# Patient Record
Sex: Male | Born: 1975 | Hispanic: No | Marital: Married | Smoking: Never smoker
Health system: Southern US, Community
[De-identification: ages and names within clinical notes are randomized; demographics above are authoritative.]

## PROBLEM LIST (undated history)

## (undated) DIAGNOSIS — T7840XA Allergy, unspecified, initial encounter: Secondary | ICD-10-CM

## (undated) DIAGNOSIS — I10 Essential (primary) hypertension: Secondary | ICD-10-CM

## (undated) HISTORY — DX: Essential (primary) hypertension: I10

## (undated) HISTORY — DX: Allergy, unspecified, initial encounter: T78.40XA

---

## 2006-04-20 ENCOUNTER — Emergency Department: Payer: Self-pay | Admitting: Emergency Medicine

## 2009-03-20 ENCOUNTER — Ambulatory Visit: Payer: Self-pay | Admitting: Internal Medicine

## 2010-09-06 ENCOUNTER — Emergency Department: Payer: Self-pay | Admitting: Emergency Medicine

## 2010-09-13 ENCOUNTER — Emergency Department: Payer: Self-pay | Admitting: Emergency Medicine

## 2010-09-16 ENCOUNTER — Emergency Department: Payer: Self-pay | Admitting: Emergency Medicine

## 2011-04-16 ENCOUNTER — Ambulatory Visit: Payer: Self-pay | Admitting: Internal Medicine

## 2015-06-10 ENCOUNTER — Encounter: Payer: Self-pay | Admitting: *Deleted

## 2015-06-18 ENCOUNTER — Ambulatory Visit (INDEPENDENT_AMBULATORY_CARE_PROVIDER_SITE_OTHER): Payer: Medicaid Other | Admitting: General Surgery

## 2015-06-18 ENCOUNTER — Encounter: Payer: Self-pay | Admitting: General Surgery

## 2015-06-18 VITALS — BP 142/98 | HR 70 | Resp 16 | Ht 78.0 in | Wt >= 6400 oz

## 2015-06-18 DIAGNOSIS — L918 Other hypertrophic disorders of the skin: Secondary | ICD-10-CM | POA: Diagnosis not present

## 2015-06-18 NOTE — Patient Instructions (Signed)
Patient to return as needed. 

## 2015-06-18 NOTE — Progress Notes (Signed)
Patient ID: Johnathan Shannon, male   DOB: 07-Aug-1975, 40 y.o.   MRN: [id number]  Chief Complaint  Patient presents with  . Other    abdomen cyst    HPI Less L Johnathan Shannon is a 40 y.o. male here today for a evaluation of abdomen cyst. Patient noticed this about 5 months ago. The area is the size of a quarter and flat. No pain or tenderness.  I have reviewed the history of present illness with the patient. HPI  Past Medical History  Diagnosis Date  . Allergy   . Hypertension     History reviewed. No pertinent past surgical history.  History reviewed. No pertinent family history.  Social History Social History  Substance Use Topics  . Smoking status: Never Smoker   . Smokeless tobacco: None  . Alcohol Use: No    No Known Allergies  Current Outpatient Prescriptions  Medication Sig Dispense Refill  . albuterol (PROVENTIL) (2.5 MG/3ML) 0.083% nebulizer solution Take 2.5 mg by nebulization every 6 (six) hours as needed for wheezing or shortness of breath.    [name]. amLODipine (NORVASC) 5 MG tablet Take 5 mg by mouth daily.    . cyclobenzaprine (FLEXERIL) 5 MG tablet Take 5 mg by mouth 3 (three) times daily as needed for muscle spasms.    [name]. etodolac (LODINE) 400 MG tablet Take 400 mg by mouth 2 (two) times daily.    . fexofenadine (ALLEGRA) 180 MG tablet Take 180 mg by mouth daily.    . sildenafil (VIAGRA) 100 MG tablet Take 100 mg by mouth daily as needed for erectile dysfunction.     No current facility-administered medications for this visit.    Review of Systems Review of Systems  Constitutional: Negative.   Respiratory: Negative.   Cardiovascular: Negative.     Blood pressure 142/98, pulse 70, resp. rate 16, height 6\' 6"  (1.981 m), weight 413 lb (187.336 kg).  Physical Exam Physical Exam  Constitutional: He is oriented to person, place, and time. He appears well-developed and well-nourished.  Neck: Neck supple.  Pulmonary/Chest:     Abdominal: Soft. Bowel sounds are  normal. There is no tenderness.  Neurological: He is alert and oriented to person, place, and time.  Skin: Skin is warm and dry.       Data Reviewed Notes reviewed  Assessment       Skin tag not symptyomatic Plan   Advised to call if the area causes symptoms-pain discomfort, inflammation, etc Patient to return as needed.     PCP:  Beverely RisenKhan, Fozia M This information has been scribed by Ples SpecterJessica Qualls CMA.    Johnathan Shannon 06/18/2015, 11:41 AM

## 2015-06-26 ENCOUNTER — Ambulatory Visit
Admission: RE | Admit: 2015-06-26 | Discharge: 2015-06-26 | Disposition: A | Discharge status: Home / Self Care | Payer: Medicaid Other | Source: Ambulatory Visit | Attending: Nurse Practitioner | Admitting: Nurse Practitioner

## 2015-06-26 ENCOUNTER — Other Ambulatory Visit: Payer: Self-pay | Admitting: Nurse Practitioner

## 2015-06-26 DIAGNOSIS — M545 Low back pain: Secondary | ICD-10-CM

## 2015-06-26 DIAGNOSIS — M542 Cervicalgia: Secondary | ICD-10-CM | POA: Insufficient documentation

## 2015-06-26 DIAGNOSIS — M5136 Other intervertebral disc degeneration, lumbar region: Secondary | ICD-10-CM | POA: Diagnosis not present

## 2015-10-23 ENCOUNTER — Emergency Department
Admission: EM | Admit: 2015-10-23 | Discharge: 2015-10-23 | Disposition: A | Discharge status: Home / Self Care | Payer: Medicaid Other | Attending: Emergency Medicine | Admitting: Emergency Medicine

## 2015-10-23 ENCOUNTER — Encounter: Payer: Self-pay | Admitting: Emergency Medicine

## 2015-10-23 DIAGNOSIS — W268XXA Contact with other sharp object(s), not elsewhere classified, initial encounter: Secondary | ICD-10-CM | POA: Diagnosis not present

## 2015-10-23 DIAGNOSIS — Z23 Encounter for immunization: Secondary | ICD-10-CM | POA: Insufficient documentation

## 2015-10-23 DIAGNOSIS — Y990 Civilian activity done for income or pay: Secondary | ICD-10-CM | POA: Insufficient documentation

## 2015-10-23 DIAGNOSIS — S61215A Laceration without foreign body of left ring finger without damage to nail, initial encounter: Secondary | ICD-10-CM | POA: Diagnosis not present

## 2015-10-23 DIAGNOSIS — I10 Essential (primary) hypertension: Secondary | ICD-10-CM | POA: Insufficient documentation

## 2015-10-23 DIAGNOSIS — Y929 Unspecified place or not applicable: Secondary | ICD-10-CM | POA: Diagnosis not present

## 2015-10-23 DIAGNOSIS — Y9389 Activity, other specified: Secondary | ICD-10-CM | POA: Insufficient documentation

## 2015-10-23 DIAGNOSIS — Z79899 Other long term (current) drug therapy: Secondary | ICD-10-CM | POA: Insufficient documentation

## 2015-10-23 MED ORDER — TETANUS-DIPHTH-ACELL PERTUSSIS 5-2.5-18.5 LF-MCG/0.5 IM SUSP
0.5000 mL | Freq: Once | INTRAMUSCULAR | Status: AC
  Administered 2015-10-23: 0.5 mL via INTRAMUSCULAR
  Filled 2015-10-23: qty 0.5

## 2015-10-23 MED ORDER — BACITRACIN ZINC 500 UNIT/GM EX OINT
TOPICAL_OINTMENT | Freq: Two times a day (BID) | CUTANEOUS | Status: AC
  Administered 2015-10-23: 1 via TOPICAL
  Filled 2015-10-23: qty 0.9

## 2015-10-23 NOTE — ED Notes (Signed)
Pt here with laceration to his left fourth (ring) finger  Pt states  "I was carry a heavy piece of steel and I tripped and when I did I tried to catch the steel and my finger got hung in one of the holes and it cut it open."  10/10 pain reported

## 2015-10-23 NOTE — ED Triage Notes (Signed)
Patient presents to the ED with a laceration to his fourth finger/ring finger on his left hand.  Patient states he was carrying a piece of metal at work and he tripped and fell and cut his finger on the metal.  Patient is in no obvious distress at this time.

## 2015-10-23 NOTE — ED Provider Notes (Signed)
[facility] Emergency Department Provider Note  ____________________________________________  Time seen: Approximately 3:35 PM  I have reviewed the triage vital signs and the nursing notes.   HISTORY  Chief Complaint Laceration    HPI Johnathan Shannon is a 40 y.o. male , NAD, presents to the emergency department with laceration to his left fourth finger that occurred yesterday afternoon around 3 PM while he was at work. States he was carrying a piece of metal, tripped and fell which caused the metal cut his finger.States he did not seek medical attention yesterday as he had another work commitment that he could not miss. Patient denies any numbness, weakness, tingling. Notes the bleeding has been controlled. Has been able to move all digits on the left hand as well as have full grip strength since the incident occurred. Denies any head injury, neck pain, dizziness, LOC. Has not had any chest pain, shortness breath, abdominal pain, nausea, vomiting.   Past Medical History:  Diagnosis Date  . Allergy   . Hypertension     There are no active problems to display for this patient.   History reviewed. No pertinent surgical history.  Prior to Admission medications   Medication Sig Start Date End Date Taking? Authorizing Provider  albuterol (PROVENTIL) (2.5 MG/3ML) 0.083% nebulizer solution Take 2.5 mg by nebulization every 6 (six) hours as needed for wheezing or shortness of breath.    Historical Provider, MD  amLODipine (NORVASC) 5 MG tablet Take 5 mg by mouth daily.    Historical Provider, MD  cyclobenzaprine (FLEXERIL) 5 MG tablet Take 5 mg by mouth 3 (three) times daily as needed for muscle spasms.    Historical Provider, MD  etodolac (LODINE) 400 MG tablet Take 400 mg by mouth 2 (two) times daily.    Historical Provider, MD  fexofenadine (ALLEGRA) 180 MG tablet Take 180 mg by mouth daily.    Historical Provider, MD  sildenafil (VIAGRA) 100 MG tablet Take 100  mg by mouth daily as needed for erectile dysfunction.    Historical Provider, MD    Allergies Review of patient's allergies indicates no known allergies.  No family history on file.  Social History Social History  Substance Use Topics  . Smoking status: Never Smoker  . Smokeless tobacco: Never Used  . Alcohol use No     Review of Systems  Constitutional: No fever/chills Eyes: No visual changes.  Cardiovascular: No chest pain. Respiratory:  No shortness of breath. No wheezing.  Gastrointestinal: No abdominal pain.  No nausea, vomiting. Musculoskeletal: Negative for back pain.  Skin: Positive laceration left fourth finger. Negative for rash, redness, swelling, bruising. Neurological: Negative for headaches, focal weakness or numbness. No tingling, LOC, head injury 10-point ROS otherwise negative.  ____________________________________________   PHYSICAL EXAM:  VITAL SIGNS: ED Triage Vitals [10/23/15 1510]  Enc Vitals Group     BP 134/88     Pulse Rate 84     Resp 18     Temp 98.7 F (37.1 C)     Temp Source Oral     SpO2 96 %     Weight (!) 408 lb (185.1 kg)     Height 6\' 8"  (2.032 m)     Head Circumference      Peak Flow      Pain Score 10     Pain Loc      Pain Edu?      Excl. in GC?      Constitutional: Alert and oriented.  Well appearing and in no acute distress. Eyes: Conjunctivae are normal.  Head: Atraumatic. Neck: Supple with FROM Hematological/Lymphatic/Immunilogical: No cervical lymphadenopathy. Cardiovascular: Good peripheral circulation with 2+ pulses noted in left upper extremity. Capillary refill is brisk in all digits of left hand. Respiratory: Normal respiratory effort without tachypnea or retractions. Musculoskeletal: Full range of motion of the left wrist, hand and all digits without pain or difficulty. Grip strength of the left hand is 5 out of 5. Strength of all digits of the left hand are 5 out of 5.  Neurologic:  Normal speech and  language. No gross focal neurologic deficits are appreciated. Sensation to light touch grossly intact about the digits of the left hand.  Skin:  Superficial laceration to the tip of the left fourth finger measuring approximately 2 cm with hypopigmented skin surrounding. Patient has had the area covered with 2 Band-Aids since the incident yesterday. No active bleeding, oozing, weeping. Area is tender to palpation. Skin is warm, dry. No rash noted. Psychiatric: Mood and affect are normal. Speech and behavior are normal. Patient exhibits appropriate insight and judgement.   ____________________________________________   LABS  None ____________________________________________  EKG  None ____________________________________________  RADIOLOGY  None ____________________________________________    PROCEDURES  Procedure(s) performed: None   .Marland KitchenLaceration Repair Date/Time: 10/23/2015 4:24 PM Performed by: Tye Savoy L Authorized by: Hope Pigeon   Consent:    Consent obtained:  Verbal   Consent given by:  Patient   Risks discussed:  Poor wound healing and infection   Alternatives discussed:  No treatment Anesthesia (see MAR for exact dosages):    Anesthesia method:  None Laceration details:    Location:  Finger   Finger location:  L ring finger   Length (cm):  1.5   Depth (mm):  1 Repair type:    Repair type:  Simple Pre-procedure details:    Patient was prepped and draped in usual sterile fashion: Alcohol and betadine cleanse. Exploration:    Hemostasis achieved with:  Direct pressure   Wound exploration: wound explored through full range of motion and entire depth of wound probed and visualized     Wound extent: no foreign bodies/material noted, no muscle damage noted, no nerve damage noted, no tendon damage noted and no vascular damage noted     Contaminated: no   Treatment:    Area cleansed with:  Betadine   Amount of cleaning:  Standard Skin repair:    Repair  method:  Steri-Strips   Number of Steri-Strips:  2 Approximation:    Approximation:  Close   Vermilion border: well-aligned   Post-procedure details:    Dressing:  Non-adherent dressing   Patient tolerance of procedure:  Tolerated well, no immediate complications     Medications  Tdap (BOOSTRIX) injection 0.5 mL (0.5 mLs Intramuscular Given 10/23/15 1613)  bacitracin ointment (1 application Topical Given 10/23/15 1605)     ____________________________________________   INITIAL IMPRESSION / ASSESSMENT AND PLAN / ED COURSE  Pertinent labs & imaging results that were available during my care of the patient were reviewed by me and considered in my medical decision making (see chart for details).  Clinical Course    Patient's diagnosis is consistent with laceration to left fourth finger without complication. Tetanus was updated today.  Patient will be discharged home with instructions to keep the finger clean and dry over the next 48 hours then may cleanse per usual. Patient is to follow up with [facility] in 48 hours for  wound recheck as needed.  Patient is given ED precautions to return to the ED for any worsening or new symptoms.    ____________________________________________  FINAL CLINICAL IMPRESSION(S) / ED DIAGNOSES  Final diagnoses:  Laceration of fourth finger, left, initial encounter      NEW MEDICATIONS STARTED DURING THIS VISIT:  New Prescriptions   No medications on file         Hope Pigeon, PA-C 10/23/15 1628    Jeanmarie Plant, MD 10/23/15 2215

## 2016-01-07 ENCOUNTER — Other Ambulatory Visit: Payer: Self-pay | Admitting: Nurse Practitioner

## 2016-01-07 DIAGNOSIS — N63 Unspecified lump in unspecified breast: Secondary | ICD-10-CM

## 2016-01-23 ENCOUNTER — Ambulatory Visit
Admission: RE | Admit: 2016-01-23 | Discharge: 2016-01-23 | Disposition: A | Discharge status: Home / Self Care | Payer: Medicaid Other | Source: Ambulatory Visit | Attending: Nurse Practitioner | Admitting: Nurse Practitioner

## 2016-01-23 DIAGNOSIS — N6489 Other specified disorders of breast: Secondary | ICD-10-CM | POA: Insufficient documentation

## 2016-01-23 DIAGNOSIS — N63 Unspecified lump in unspecified breast: Secondary | ICD-10-CM

## 2016-02-04 ENCOUNTER — Ambulatory Visit: Payer: Medicaid Other | Attending: Orthopedic Surgery | Admitting: Physical Therapy

## 2016-02-17 ENCOUNTER — Ambulatory Visit: Payer: Medicaid Other | Attending: Orthopedic Surgery

## 2016-02-17 DIAGNOSIS — G8929 Other chronic pain: Secondary | ICD-10-CM

## 2016-02-17 DIAGNOSIS — M545 Low back pain, unspecified: Secondary | ICD-10-CM

## 2016-02-17 NOTE — Therapy (Signed)
[place] [facility] MAIN [facility] SERVICES [address] [place], [place], [address] Phone: [phone]   Fax:  [phone]  Physical Therapy Evaluation  Patient Details  Name: Johnathan Shannon MRN: [id number] Date of Birth: 03/23/1975 Referring Provider: Martha Clan  Encounter Date: 02/17/2016      PT End of Session - 02/17/16 0949    Visit Number 1   Number of Visits 1   Date for PT Re-Evaluation 02/24/16   Authorization Type medicaid   PT Start Time 0905   PT Stop Time 0945   PT Time Calculation (min) 40 min   Activity Tolerance Patient tolerated treatment well   Behavior During Therapy [facility] for tasks assessed/performed      Past Medical History:  Diagnosis Date  . Allergy   . Hypertension     No past surgical history on file.  There were no vitals filed for this visit.       Subjective Assessment - 02/17/16 0908    Subjective Back pain   Pertinent History Pt reports that he has a history of back pain for the last year. States that he was working for a [employer] but was laid off when they found out he had a history of low back problems. Pt reports that after he stopped working his back pain worsened. He describes the pain as "pressure" midline low back. Only aggrating factor is extended standing. ROS negative for red flags   How long can you walk comfortably? 5 minutes   Diagnostic tests Radiographs: DDD   Patient Stated Goals Decrease pain   Currently in Pain? No/denies   Pain Score 0-No pain  Worst: 10/10, present: 0/10, best: 0/10   Pain Location Back   Pain Orientation Lower;Other (Comment)  Midline   Pain Descriptors / Indicators Pressure   Pain Type Chronic pain   Pain Radiating Towards None   Pain Onset More than a month ago   Pain Frequency Intermittent   Aggravating Factors  Standing for extended periods of time   Pain Relieving Factors Nothing   Multiple Pain Sites No            [facility] PT Assessment -  02/17/16 0912      Assessment   Medical Diagnosis Low back pain   Referring Provider Martha Clan   Onset Date/Surgical Date 02/17/15   Hand Dominance Right  Uses both hands   Next MD Visit None   Prior Therapy None     Precautions   Precautions None     Restrictions   Weight Bearing Restrictions No     Balance Screen   Has the patient fallen in the past 6 months No     Home Environment   Living Environment Private residence     Prior Function   Level of Independence Independent   Vocation Unemployed   Leisure Basketball, working on cars     Cognition   Overall Cognitive Status Within Functional Limits for tasks assessed     Sensation   Additional Comments Normal sensation L2-S2     Posture/Postural Control   Posture Comments Overweight, forward head     ROM / Strength   AROM / PROM / Strength Strength;AROM     AROM   Overall AROM Comments Lumbar AROM is grossly WFL (mild limited in flexion). Painless with AROM and OP. Moderate loss of bilateral hip IR. HS length is limited at approximately 60 degrees bilaterally. Negative slump and SLR. CPA testing is negative for  reproduction of pain L1-L5. Lumbar mobility difficult to assess due to patient's size. Negative sacral thrust. Hip scour is negative.      Strength   Overall Strength Comments 5/5 strength throughout LE's without focal weakness noted     Palpation   Palpation comment No tenderness/pain reported with palpation along spinouse processes or paraspinal muscles. No step-off noted     Transfers   Comments Independent for all transfers and ambulation without assistive device                           PT Education - 02/17/16 0947    Education provided Yes   Education Details Plan of care, HEP, weight loss, regular exercise   Person(s) Educated Patient   Methods Explanation;Demonstration;Handout   Comprehension Verbalized understanding             PT Long Term Goals - 02/17/16 1002       PT LONG TERM GOAL #1   Title Pt will be independent with HEP in order to improve strength and balance in order to decrease fall risk and improve function at home and work.   Time 1   Period Weeks   Status Achieved               Plan - 02/17/16 0949    Clinical Impression Statement Pt is a pleasant 40 yo male referred for chronic low back pain. Pt reports that the only aggravating factor for his back pain is standing for an extended period of time. Reports that radiographs were obtained of lower spine which revealed DDD. PT examination today is unable to reproduce low back pain. Pt is nontender to palpation and posterior to anterior segemental mobility is painless. Negative slump and SLR. Negative hip scour and sacral thrust. AROM of lumbar spine with overpressure is normal and painless in all directions. Pt does have limited mobility with hip internal rotation as well as decreased hamstring length. Discussed walking program as well as enrollment in regular fitness program at local gym. Pt provided information regarding gyms where he may qualify for reduced fees based on self-reported financial constraints. Discussed the benefit of aquatic exercise as pt states that he likes to swim. Discussed the role of weight loss and regular physical activity with respect to chronic low back pain. Pt was issued a program for low back stretching and strengthening. Unfortunately Medicaid will not pay for any follow-up treatment sessions. Pt will be discharged at this time with a home exercise program and encouraged to join a community based fitness center for regular exercise.    Rehab Potential Good   Clinical Impairments Affecting Rehab Potential Positive: motivation, age; Negative: chronicity, overweight   PT Frequency One time visit   PT Duration --  1 week   PT Treatment/Interventions Therapeutic exercise   PT Next Visit Plan Discharge   PT Home Exercise Plan Single knee to chest, double knee to  chest, hooklying lumbar rotation stretch, HS strap stretch, hooklying bridges   Consulted and Agree with Plan of Care Patient      Patient will benefit from skilled therapeutic intervention in order to improve the following deficits and impairments:  Pain  Visit Diagnosis: Chronic midline low back pain without sciatica - Plan: PT plan of care cert/re-cert     Problem List There are no active problems to display for this patient.  Sharalyn InkJason D Ranisha Allaire PT, DPT   Arline Ketter 02/17/2016, 10:08 AM  [place]  [facility] [address] [place], [place], [address] Phone: [phone]   Fax:  [phone]  Name: Evlyn Kannerbjul L Anthes MRN: [id number] Date of Birth: Jul 28, 1975

## 2016-02-17 NOTE — Patient Instructions (Signed)
Knee-to-Chest Stretch: Unilateral    With hand behind right knee, pull knee in to chest until a comfortable stretch is felt in lower back and buttocks. Keep back relaxed. Hold _30___ seconds. Alternate and perform on other leg. Repeat _3___ times per session on each leg. Do __3__ sessions per day.   Knee-to-Chest Stretch: Bilateral    With hands behind knees, pull both knees in to chest until a comfortable stretch is felt in lower back and buttocks. Keep back relaxed. Hold _30___ seconds. Repeat _3___ times per session. Do __3__ sessions per day.   Supine: Leg Stretch With Strap (Basic)    Lie on back with one knee bent, foot flat on floor. Hook strap around other foot. Straighten knee. Bring up leg until you feel a stretch. Hold _30__ seconds. Relax leg completely down to floor. And then perform with the other leg. Perform 3 sets of 30 second stretches on each leg. Do _3__ sessions per day.   Lower Trunk Rotation Stretch    Keeping back flat and feet together, rotate knees to left side. Hold _30___ seconds. Alternate and perform on other side. Repeat _3___ times per session on each side. Do __3__ sessions per day.   Bridge [employer]ose    Press small of back into mat, maintain pelvic position, Focus on engaging posterior hip muscles and lift bottom off ground. Hold for __3__ seconds. Repeat _10___ times. Relax and perform 2 more times. Perform 3 times per day.

## 2016-07-31 ENCOUNTER — Encounter: Payer: Self-pay | Admitting: Emergency Medicine

## 2016-07-31 ENCOUNTER — Emergency Department: Payer: Worker's Compensation

## 2016-07-31 ENCOUNTER — Emergency Department
Admission: EM | Admit: 2016-07-31 | Discharge: 2016-07-31 | Disposition: A | Discharge status: Home / Self Care | Payer: Worker's Compensation | Attending: Emergency Medicine | Admitting: Emergency Medicine

## 2016-07-31 DIAGNOSIS — Y990 Civilian activity done for income or pay: Secondary | ICD-10-CM | POA: Insufficient documentation

## 2016-07-31 DIAGNOSIS — S86812A Strain of other muscle(s) and tendon(s) at lower leg level, left leg, initial encounter: Secondary | ICD-10-CM | POA: Diagnosis not present

## 2016-07-31 DIAGNOSIS — S93402A Sprain of unspecified ligament of left ankle, initial encounter: Secondary | ICD-10-CM

## 2016-07-31 DIAGNOSIS — W010XXA Fall on same level from slipping, tripping and stumbling without subsequent striking against object, initial encounter: Secondary | ICD-10-CM | POA: Insufficient documentation

## 2016-07-31 DIAGNOSIS — I10 Essential (primary) hypertension: Secondary | ICD-10-CM | POA: Insufficient documentation

## 2016-07-31 DIAGNOSIS — T148XXA Other injury of unspecified body region, initial encounter: Secondary | ICD-10-CM

## 2016-07-31 DIAGNOSIS — Y929 Unspecified place or not applicable: Secondary | ICD-10-CM | POA: Insufficient documentation

## 2016-07-31 DIAGNOSIS — Y939 Activity, unspecified: Secondary | ICD-10-CM | POA: Diagnosis not present

## 2016-07-31 DIAGNOSIS — S8992XA Unspecified injury of left lower leg, initial encounter: Secondary | ICD-10-CM | POA: Diagnosis present

## 2016-07-31 MED ORDER — CYCLOBENZAPRINE HCL 10 MG PO TABS: 10.0000 mg | ORAL_TABLET | Freq: Three times a day (TID) | ORAL | 0 refills | Status: DC | PRN

## 2016-07-31 MED ORDER — MELOXICAM 15 MG PO TABS: 15.0000 mg | ORAL_TABLET | Freq: Every day | ORAL | 0 refills | Status: DC

## 2016-07-31 NOTE — ED Notes (Signed)
See triage note  states he was running on Friday and foot stopped and he didn't  Larey SeatFell backwards  Bent left knee under him  Having pain to left knee and lower leg  Ambulates with limp

## 2016-07-31 NOTE — ED Provider Notes (Signed)
[facility] Emergency Department Provider Note ____________________________________________  Time seen: Approximately 1:08 PM  I have reviewed the triage vital signs and the nursing notes.   HISTORY  Chief Complaint Leg Pain    HPI Johnathan Shannon is a 41 y.o. male who presents to the emergency department for evaluation of left lower extremity pain. He states that he injured his leg on Thursday while at work. He states that he fell in the leg folded behind him and he crumpled on top of it. He now has pain behind the knee, in the calf, and in the left ankle with some swelling. He has taken Tylenol without relief. He is able to ambulate, but this increases the pain.  Past Medical History:  Diagnosis Date  . Allergy   . Hypertension     There are no active problems to display for this patient.   History reviewed. No pertinent surgical history.  Prior to Admission medications   Medication Sig Start Date End Date Taking? Authorizing Provider  albuterol (PROVENTIL) (2.5 MG/3ML) 0.083% nebulizer solution Take 2.5 mg by nebulization every 6 (six) hours as needed for wheezing or shortness of breath.    [provider]  amLODipine (NORVASC) 5 MG tablet Take 5 mg by mouth daily.    [provider]  cyclobenzaprine (FLEXERIL) 10 MG tablet Take 1 tablet (10 mg total) by mouth 3 (three) times daily as needed for muscle spasms. 07/31/16   Shakai Dolley B, FNP  fexofenadine (ALLEGRA) 180 MG tablet Take 180 mg by mouth daily.    [provider]  meloxicam (MOBIC) 15 MG tablet Take 1 tablet (15 mg total) by mouth daily. 07/31/16   Marqueze Ramcharan, Rulon Eisenmenger B, FNP  sildenafil (VIAGRA) 100 MG tablet Take 100 mg by mouth daily as needed for erectile dysfunction.    [provider]    Allergies Patient has no known allergies.  History reviewed. No pertinent family history.  Social History Social History  Substance Use Topics  . Smoking status:  Never Smoker  . Smokeless tobacco: Never Used  . Alcohol use No    Review of Systems Constitutional: Positive for recent injury. Cardiovascular: Negative for cool sensation in the left lower extremity Respiratory: Negative for cough or shortness of breath. Musculoskeletal: Positive for pain in the left lower extremity. Skin: Negative for lesion or wound  Neurological: Negative for loss of sensation.  ____________________________________________   PHYSICAL EXAM:  VITAL SIGNS: ED Triage Vitals  Enc Vitals Group     BP 07/31/16 1232 (!) 179/87     Pulse Rate 07/31/16 1232 72     Resp 07/31/16 1232 18     Temp 07/31/16 1232 99 F (37.2 C)     Temp Source 07/31/16 1232 Oral     SpO2 07/31/16 1232 95 %     Weight 07/31/16 1233 (!) 399 lb (181 kg)     Height 07/31/16 1233 6\' 7"  (2.007 m)     Head Circumference --      Peak Flow --      Pain Score 07/31/16 1250 7     Pain Loc --      Pain Edu? --      Excl. in GC? --     Constitutional: Alert and oriented. Well appearing and in no acute distress. Eyes: Conjunctiva are clear and without drainage.  Head: Atraumatic Neck: Nexus criteria is negative Respiratory: Respirations are even and unlabored Musculoskeletal: Full range of motion of the left lower  extremity including the knee and ankle. There is some mild swelling of the left ankle over the ATFL pattern. Neurologic: Alert and oriented. GCS is 15. Neurovascularly intact.  Skin: Warm and dry without wound or lesion noted over left lower extremity.  Psychiatric: Normal behavior. Normal affect.  ____________________________________________   LABS (all labs ordered are listed, but only abnormal results are displayed)  Labs Reviewed - No data to display ____________________________________________  RADIOLOGY  Left tib-fib and ankle are negative for acute abnormality per radiology. ____________________________________________   PROCEDURES  Procedure(s) performed: Ace  bandage applied to the left ankle by RN.  ____________________________________________   INITIAL IMPRESSION / ASSESSMENT AND PLAN / ED COURSE  Johnathan Shannon is a 41 y.o. male who presents to the emergency department for evaluation of left lower extremity pain after sustaining a mechanical, non-syncopal fall at work on Thursday. He states the pain has increased since that time and has not been relieved with Tylenol. X-rays and exam are both consistent with muscular strain and ankle sprain. He'll be given prescriptions for Flexeril and meloxicam. He was instructed to follow up with his primary care provider for symptoms that are not improving over the week. He was advised to return to the emergency department for symptoms that change or worsen if he is unable schedule appointment.  Pertinent labs & imaging results that were available during my care of the patient were reviewed by me and considered in my medical decision making (see chart for details).  _________________________________________   FINAL CLINICAL IMPRESSION(S) / ED DIAGNOSES  Final diagnoses:  Muscle strain  Sprain of left ankle, unspecified ligament, initial encounter    Discharge Medication List as of 07/31/2016  1:46 PM    START taking these medications   Details  meloxicam (MOBIC) 15 MG tablet Take 1 tablet (15 mg total) by mouth daily., Starting Sat 07/31/2016, Print        If controlled substance prescribed during this visit, 12 month history viewed on the [place] prior to issuing an initial prescription for Schedule II or III opiod.    Chinita Pesterriplett, Michiah Mudry B, FNP 07/31/16 1445    Arnaldo NatalMalinda, Paul F, MD 07/31/16 1544

## 2016-07-31 NOTE — ED Triage Notes (Signed)
Pt fell at work on Friday and has been having left leg and ankle pain. Ambulatory to triage with limp r/t pain.  Thought pain would get better but it has not.  NAD. VSS

## 2016-07-31 NOTE — ED Notes (Signed)
Pt states this occurred at work. Pt does not want to file wc at this time since he just started his job. Advised he needs to speak with his employer is he changes his mind.

## 2016-09-08 ENCOUNTER — Encounter: Payer: Self-pay | Admitting: *Deleted

## 2016-09-08 ENCOUNTER — Emergency Department
Admission: EM | Admit: 2016-09-08 | Discharge: 2016-09-08 | Disposition: A | Discharge status: Home / Self Care | Payer: Medicaid Other | Attending: Student in an Organized Health Care Education/Training Program | Admitting: Student in an Organized Health Care Education/Training Program

## 2016-09-08 DIAGNOSIS — Z791 Long term (current) use of non-steroidal anti-inflammatories (NSAID): Secondary | ICD-10-CM | POA: Diagnosis not present

## 2016-09-08 DIAGNOSIS — Z79899 Other long term (current) drug therapy: Secondary | ICD-10-CM | POA: Diagnosis not present

## 2016-09-08 DIAGNOSIS — I10 Essential (primary) hypertension: Secondary | ICD-10-CM | POA: Insufficient documentation

## 2016-09-08 DIAGNOSIS — T63441A Toxic effect of venom of bees, accidental (unintentional), initial encounter: Secondary | ICD-10-CM | POA: Insufficient documentation

## 2016-09-08 DIAGNOSIS — M79632 Pain in left forearm: Secondary | ICD-10-CM | POA: Diagnosis present

## 2016-09-08 MED ORDER — HYDROXYZINE HCL 50 MG PO TABS
50.0000 mg | ORAL_TABLET | Freq: Three times a day (TID) | ORAL | 0 refills | Status: DC | PRN
Start: 2016-09-08 — End: 2017-12-16

## 2016-09-08 MED ORDER — METHYLPREDNISOLONE 4 MG PO TBPK: ORAL_TABLET | ORAL | 0 refills | Status: DC

## 2016-09-08 MED ORDER — HYDROXYZINE HCL 50 MG PO TABS
50.0000 mg | ORAL_TABLET | Freq: Once | ORAL | Status: AC
  Administered 2016-09-08: 50 mg via ORAL
  Filled 2016-09-08: qty 1

## 2016-09-08 MED ORDER — METHYLPREDNISOLONE SODIUM SUCC 125 MG IJ SOLR
125.0000 mg | Freq: Once | INTRAMUSCULAR | Status: AC
  Administered 2016-09-08: 125 mg via INTRAMUSCULAR
  Filled 2016-09-08: qty 2

## 2016-09-08 NOTE — ED Notes (Signed)
See triage note  States he was stung by a wasp to left elbow area yesterday  Left arm is swollen and tender

## 2016-09-08 NOTE — ED Provider Notes (Signed)
[facility] Emergency Department Provider Note   ____________________________________________   First MD Initiated Contact with Patient 09/08/16 1023     (approximate)  I have reviewed the triage vital signs and the nursing notes.   HISTORY  Chief Complaint Insect Bite    HPI Johnathan Shannon is a 41 y.o. male patient complain of a swollen left forearm and itching secondary to a bee sting yesterday. Patient stated this happened/year when he got stung in the left hand causing edema. Patient denies any anti-phylactic signs symptoms. No palliative measures taken for complaint.   Past Medical History:  Diagnosis Date  . Allergy   . Hypertension     There are no active problems to display for this patient.   History reviewed. No pertinent surgical history.  Prior to Admission medications   Medication Sig Start Date End Date Taking? Authorizing Provider  albuterol (PROVENTIL) (2.5 MG/3ML) 0.083% nebulizer solution Take 2.5 mg by nebulization every 6 (six) hours as needed for wheezing or shortness of breath.    [provider]  amLODipine (NORVASC) 5 MG tablet Take 5 mg by mouth daily.    [provider]  cyclobenzaprine (FLEXERIL) 10 MG tablet Take 1 tablet (10 mg total) by mouth 3 (three) times daily as needed for muscle spasms. 07/31/16   Triplett, Cari B, FNP  fexofenadine (ALLEGRA) 180 MG tablet Take 180 mg by mouth daily.    [provider]  hydrOXYzine (ATARAX/VISTARIL) 50 MG tablet Take 1 tablet (50 mg total) by mouth 3 (three) times daily as needed. 09/08/16   Joni ReiningSmith, Johnathan Brander K, PA-C  meloxicam (MOBIC) 15 MG tablet Take 1 tablet (15 mg total) by mouth daily. 07/31/16   Triplett, Cari B, FNP  methylPREDNISolone (MEDROL DOSEPAK) 4 MG TBPK tablet Take Tapered dose as directed . start tomorrow morning 09/08/16   Joni ReiningSmith, Johnathan Coltrane K, PA-C  sildenafil (VIAGRA) 100 MG tablet Take 100 mg by mouth daily as needed for erectile dysfunction.     [provider]    Allergies Bee venom  History reviewed. No pertinent family history.  Social History Social History  Substance Use Topics  . Smoking status: Never Smoker  . Smokeless tobacco: Never Used  . Alcohol use No    Review of Systems  Constitutional: No fever/chills Eyes: No visual changes. ENT: No sore throat. Cardiovascular: Denies chest pain. Respiratory: Denies shortness of breath. Gastrointestinal: No abdominal pain.  No nausea, no vomiting.  No diarrhea.  No constipation. Genitourinary: Negative for dysuria. Musculoskeletal: Negative for back pain. Skin: Negative for rash. Swelling to left forearm Neurological: Negative for headaches, focal weakness or numbness. Allergic/Immunilogical: Bee sting ____________________________________________   PHYSICAL EXAM:  VITAL SIGNS: ED Triage Vitals  Enc Vitals Group     BP 09/08/16 0857 (!) 151/108     Pulse Rate 09/08/16 0857 81     Resp 09/08/16 0857 18     Temp 09/08/16 0857 97.8 F (36.6 C)     Temp Source 09/08/16 0857 Oral     SpO2 09/08/16 0857 97 %     Weight 09/08/16 0859 (!) 380 lb (172.4 kg)     Height 09/08/16 0859 6\' 7"  (2.007 m)     Head Circumference --      Peak Flow --      Pain Score 09/08/16 0859 10     Pain Loc --      Pain Edu? --      Excl. in GC? --  Constitutional: Alert and oriented. Well appearing and in no acute distress.  Eyes: Conjunctivae are normal. PERRL. EOMI. Head: Atraumatic. Nose: No congestion/rhinnorhea. Mouth/Throat: Mucous membranes are moist.  Oropharynx non-erythematous. Neck: No stridor.  palpation. Cardiovascular: Normal rate, regular rhythm. Grossly normal heart sounds.  Good peripheral circulation. Respiratory: Normal respiratory effort.  No retractions. Lungs CTAB. Neurologic:  Normal speech and language. No gross focal neurologic deficits are appreciated. No gait instability. Skin:  Skin is warm, dry and intact. No rash noted. Edematous  non- erythematous left forearm measuring 39-1/2 cm versus 33-1/2 cm of the right forearm. Psychiatric: Mood and affect are normal. Speech and behavior are normal.  ____________________________________________   LABS (all labs ordered are listed, but only abnormal results are displayed)  Labs Reviewed - No data to display ____________________________________________  EKG   ____________________________________________  RADIOLOGY  No results found.  ____________________________________________   PROCEDURES  Procedure(s) performed: None  Procedures  Critical Care performed: No  ____________________________________________   INITIAL IMPRESSION / ASSESSMENT AND PLAN / ED COURSE  Pertinent labs & imaging results that were available during my care of the patient were reviewed by me and considered in my medical decision making (see chart for details).  Localized reaction to insect bite. Patient has no signs of anaphylactic reaction. Patient given discharge care instructions and advised to see his PCP to consider carrying EpiPen.      ____________________________________________   FINAL CLINICAL IMPRESSION(S) / ED DIAGNOSES  Final diagnoses:  Bee sting reaction, accidental or unintentional, initial encounter      NEW MEDICATIONS STARTED DURING THIS VISIT:  Discharge Medication List as of 09/08/2016 10:33 AM    START taking these medications   Details  hydrOXYzine (ATARAX/VISTARIL) 50 MG tablet Take 1 tablet (50 mg total) by mouth 3 (three) times daily as needed., Starting Wed 09/08/2016, Print    methylPREDNISolone (MEDROL DOSEPAK) 4 MG TBPK tablet Take Tapered dose as directed . start tomorrow morning, Print         Note:  This document was prepared using Dragon voice recognition software and may include unintentional dictation errors.    Joni Reining, PA-C 09/08/16 1058    Willy Eddy, MD 09/08/16 [phone]

## 2016-09-08 NOTE — ED Triage Notes (Signed)
States he was stung by a bee yesterday on his left arm, arm slightly swollen, denies any medication after sting yesterday, resp even and unlabored in no acute distress

## 2017-05-04 ENCOUNTER — Emergency Department
Admission: EM | Admit: 2017-05-04 | Discharge: 2017-05-04 | Disposition: A | Discharge status: Home / Self Care | Payer: Medicaid Other | Attending: Emergency Medicine | Admitting: Emergency Medicine

## 2017-05-04 ENCOUNTER — Emergency Department: Payer: Medicaid Other

## 2017-05-04 ENCOUNTER — Encounter: Payer: Self-pay | Admitting: Intensive Care

## 2017-05-04 DIAGNOSIS — Z79899 Other long term (current) drug therapy: Secondary | ICD-10-CM | POA: Diagnosis not present

## 2017-05-04 DIAGNOSIS — M5442 Lumbago with sciatica, left side: Secondary | ICD-10-CM | POA: Insufficient documentation

## 2017-05-04 DIAGNOSIS — M549 Dorsalgia, unspecified: Secondary | ICD-10-CM | POA: Diagnosis present

## 2017-05-04 DIAGNOSIS — I10 Essential (primary) hypertension: Secondary | ICD-10-CM | POA: Diagnosis not present

## 2017-05-04 DIAGNOSIS — M5441 Lumbago with sciatica, right side: Secondary | ICD-10-CM | POA: Insufficient documentation

## 2017-05-04 LAB — POC URINE PREG, ED: Preg Test, Ur: NEGATIVE

## 2017-05-04 MED ORDER — PREDNISONE 50 MG PO TABS
ORAL_TABLET | ORAL | 0 refills | Status: DC
Start: 2017-05-04 — End: 2017-12-16

## 2017-05-04 NOTE — ED Notes (Signed)
Error in documentation. Pt did not have preg test.

## 2017-05-04 NOTE — ED Notes (Signed)
See triage note  Presents with pain to lower back  States pain is non radiating  Also has been having some swelling to both lower ext for about 2 weeks  Denies any SOB

## 2017-05-04 NOTE — ED Triage Notes (Signed)
Patient states he has been having lower back problems X2 years. T11 and T14 per patient is messed up. Patient states lower legs have been burning and hurting him recently. Pt ambulatory in triage with no obvious injuries. Denies recent injury trauma to legs or back

## 2017-05-05 NOTE — ED Provider Notes (Signed)
[facility] Emergency Department Provider Note  ____________________________________________  Time seen: Approximately 12:29 AM  I have reviewed the triage vital signs and the nursing notes.   HISTORY  Chief Complaint Back Pain    HPI Johnathan Shannon is a 42 y.o. male presents to the emergency department with low back pain that radiates into the lower extremities for the past 2 weeks.  Patient denies falls or mechanisms of trauma.  Patient reports that he was doing more heavy lifting in his place of work but is recently been laid off.  Patient reports a history of "back problems".  Patient reports that something was "wrong with T11 and 14".  He denies saddle anesthesi and bowel/bladder incontinence.  Patient reports that he has not followed up with primary care in several years.  Patient denies shortness of breath, chest pain, chest tightness, recent surgery, prolonged immobility, and history of DVT.   Past Medical History:  Diagnosis Date  . Allergy   . Hypertension     There are no active problems to display for this patient.   History reviewed. No pertinent surgical history.  Prior to Admission medications   Medication Sig Start Date End Date Taking? Authorizing Provider  albuterol (PROVENTIL) (2.5 MG/3ML) 0.083% nebulizer solution Take 2.5 mg by nebulization every 6 (six) hours as needed for wheezing or shortness of breath.    [provider]  amLODipine (NORVASC) 5 MG tablet Take 5 mg by mouth daily.    [provider]  cyclobenzaprine (FLEXERIL) 10 MG tablet Take 1 tablet (10 mg total) by mouth 3 (three) times daily as needed for muscle spasms. 07/31/16   Triplett, Cari B, FNP  fexofenadine (ALLEGRA) 180 MG tablet Take 180 mg by mouth daily.    [provider]  hydrOXYzine (ATARAX/VISTARIL) 50 MG tablet Take 1 tablet (50 mg total) by mouth 3 (three) times daily as needed. 09/08/16   Joni ReiningSmith, Ronald K, PA-C  meloxicam (MOBIC) 15  MG tablet Take 1 tablet (15 mg total) by mouth daily. 07/31/16   Triplett, Cari B, FNP  methylPREDNISolone (MEDROL DOSEPAK) 4 MG TBPK tablet Take Tapered dose as directed . start tomorrow morning 09/08/16   Joni ReiningSmith, Ronald K, PA-C  predniSONE (DELTASONE) 50 MG tablet Take one 50 mg tablet once a day for 5 days. 05/04/17   Orvil FeilWoods, Guerin Lashomb M, PA-C  sildenafil (VIAGRA) 100 MG tablet Take 100 mg by mouth daily as needed for erectile dysfunction.    [provider]    Allergies Bee venom  History reviewed. No pertinent family history.  Social History Social History   Tobacco Use  . Smoking status: Never Smoker  . Smokeless tobacco: Never Used  Substance Use Topics  . Alcohol use: No    Alcohol/week: 0.0 oz  . Drug use: No     Review of Systems  Constitutional: No fever/chills Eyes: No visual changes. No discharge ENT: No upper respiratory complaints. Cardiovascular: no chest pain. Respiratory: no cough. No SOB. Gastrointestinal: No abdominal pain.  No nausea, no vomiting.  No diarrhea.  No constipation. Musculoskeletal: Patient has low back pain.  Skin: Negative for rash, abrasions, lacerations, ecchymosis. Neurological: Negative for headaches, focal weakness or numbness.  ____________________________________________   PHYSICAL EXAM:  VITAL SIGNS: ED Triage Vitals [05/04/17 1457]  Enc Vitals Group     BP (!) 164/110     Pulse Rate 78     Resp 16     Temp (!) 97.5 F (36.4 C)  Temp Source Oral     SpO2 98 %     Weight (!) 407 lb 3.2 oz (184.7 kg)     Height 6\' 8"  (2.032 m)     Head Circumference      Peak Flow      Pain Score 10     Pain Loc      Pain Edu?      Excl. in GC?      Constitutional: Alert and oriented. Well appearing and in no acute distress. Eyes: Conjunctivae are normal. PERRL. EOMI. Head: Atraumatic. Cardiovascular: Normal rate, regular rhythm. Normal S1 and S2.  Good peripheral circulation. Respiratory: Normal respiratory effort without  tachypnea or retractions. Lungs CTAB. Good air entry to the bases with no decreased or absent breath sounds. Musculoskeletal: Positive straight leg raise test bilaterally.  Lumbar paraspinal muscle tenderness appreciated. Neurologic:  Normal speech and language. No gross focal neurologic deficits are appreciated.  Skin: Patient has bilateral lower extremity pitting edema. Psychiatric: Mood and affect are normal. Speech and behavior are normal. Patient exhibits appropriate insight and judgement.   ____________________________________________   LABS (all labs ordered are listed, but only abnormal results are displayed)  Labs Reviewed  POC URINE PREG, ED   ____________________________________________  EKG   ____________________________________________  RADIOLOGY Geraldo Pitter, personally viewed and evaluated these images (plain radiographs) as part of my medical decision making, as well as reviewing the written report by the radiologist.  Dg Lumbar Spine 2-3 Views  Result Date: 05/04/2017 CLINICAL DATA:  Low back pain for 1.5 years with leg pain for 1.5 weeks. No known injury or surgery. EXAM: LUMBAR SPINE - 2-3 VIEW COMPARISON:  06/26/2015 FINDINGS: There is no evidence of lumbar spine fracture. Alignment is normal. Intervertebral disc spaces are maintained. IMPRESSION: Stable lumbar spine radiographs without significant disc flattening nor acute osseous abnormality noted. Electronically Signed   By: Tollie Eth M.D.   On: 05/04/2017 16:53    ____________________________________________    PROCEDURES  Procedure(s) performed:    Procedures    Medications - No data to display   ____________________________________________   INITIAL IMPRESSION / ASSESSMENT AND PLAN / ED COURSE  Pertinent labs & imaging results that were available during my care of the patient were reviewed by me and considered in my medical decision making (see chart for details).  Review of the [place] was performed in accordance of the [facility] prior to dispensing any controlled drugs.     Assessment and plan Low back pain with bilateral lower extremity radiculopathy Patient presents to the emergency department with low back pain.  Differential diagnosis included sciatica, lumbar strain and herniated disc.  Patient was treated empirically with prednisone.  Patient was advised to follow-up with primary care regarding bilateral lower extremity pitting edema.  Patient exhibits no chest pain, chest tightness, shortness of breath or abdominal pain.  Further workup is not warranted at this time.  Patient was also advised to address hypertension with primary care noted at triage.  All patient questions were answered.    ____________________________________________  FINAL CLINICAL IMPRESSION(S) / ED DIAGNOSES  Final diagnoses:  Acute bilateral low back pain with bilateral sciatica      NEW MEDICATIONS STARTED DURING THIS VISIT:  ED Discharge Orders        Ordered    predniSONE (DELTASONE) 50 MG tablet     05/04/17 1720          This chart was dictated using voice recognition software/Dragon. Despite  best efforts to proofread, errors can occur which can change the meaning. Any change was purely unintentional.    Orvil Feil, PA-C 05/05/17 0033    Myrna Blazer, MD 05/05/17 [phone]

## 2017-11-01 ENCOUNTER — Ambulatory Visit: Payer: Medicaid Other | Admitting: Adult Health

## 2017-11-01 ENCOUNTER — Ambulatory Visit
Admission: RE | Admit: 2017-11-01 | Discharge: 2017-11-01 | Disposition: A | Discharge status: Home / Self Care | Payer: Medicaid Other | Source: Ambulatory Visit | Attending: Adult Health | Admitting: Adult Health

## 2017-11-01 ENCOUNTER — Encounter: Payer: Self-pay | Admitting: Adult Health

## 2017-11-01 ENCOUNTER — Telehealth: Payer: Self-pay

## 2017-11-01 VITALS — BP 160/110 | HR 94 | Temp 98.9°F | Resp 16 | Ht 79.5 in | Wt >= 6400 oz

## 2017-11-01 DIAGNOSIS — G8929 Other chronic pain: Secondary | ICD-10-CM

## 2017-11-01 DIAGNOSIS — M545 Low back pain, unspecified: Secondary | ICD-10-CM

## 2017-11-01 MED ORDER — MELOXICAM 15 MG PO TABS: 15.0000 mg | ORAL_TABLET | Freq: Every day | ORAL | 0 refills | Status: DC

## 2017-11-01 MED ORDER — METHYLPREDNISOLONE 4 MG PO TBPK
ORAL_TABLET | ORAL | 0 refills | Status: DC
Start: 2017-11-01 — End: 2017-12-16

## 2017-11-01 NOTE — Patient Instructions (Signed)

## 2017-11-01 NOTE — Telephone Encounter (Signed)
Pt advised xray showed no abnormalities

## 2017-11-01 NOTE — Progress Notes (Signed)
[facility] [address] [place], [place] [id number]  Internal MEDICINE  Office Visit Note  Patient Name: Johnathan Shannon  [id number]  [id number]  Date of Service: 11/17/2017  Chief Complaint  Patient presents with  . Back Pain    standing increases lower back pain going on since 2017     HPI Pt is here for a sick visit.  Pt here reporting long time lower back pain that shoots down both of his legs. He reports this has happened to him before, but not this bad.  He would like to see an orthopedist about his back.  Has taken antiinflammatory medication with no relief.  The pain has been persistent for over 2 months now.    Current Medication:  Outpatient Encounter Medications as of 11/01/2017  Medication Sig  . albuterol (PROVENTIL) (2.5 MG/3ML) 0.083% nebulizer solution Take 2.5 mg by nebulization every 6 (six) hours as needed for wheezing or shortness of breath.  [name]. amLODipine (NORVASC) 5 MG tablet Take 5 mg by mouth daily.  . cyclobenzaprine (FLEXERIL) 10 MG tablet Take 1 tablet (10 mg total) by mouth 3 (three) times daily as needed for muscle spasms. (Patient not taking: Reported on 11/01/2017)  . fexofenadine (ALLEGRA) 180 MG tablet Take 180 mg by mouth daily.  . hydrOXYzine (ATARAX/VISTARIL) 50 MG tablet Take 1 tablet (50 mg total) by mouth 3 (three) times daily as needed. (Patient not taking: Reported on 11/01/2017)  . meloxicam (MOBIC) 15 MG tablet Take 1 tablet (15 mg total) by mouth daily.  . methylPREDNISolone (MEDROL DOSEPAK) 4 MG TBPK tablet Take Tapered dose as directed . start tomorrow morning  . predniSONE (DELTASONE) 50 MG tablet Take one 50 mg tablet once a day for 5 days. (Patient not taking: Reported on 11/01/2017)  . sildenafil (VIAGRA) 100 MG tablet Take 100 mg by mouth daily as needed for erectile dysfunction.  . [DISCONTINUED] meloxicam (MOBIC) 15 MG tablet Take 1 tablet (15 mg total) by mouth daily. (Patient not taking: Reported on 11/01/2017)  .  [DISCONTINUED] methylPREDNISolone (MEDROL DOSEPAK) 4 MG TBPK tablet Take Tapered dose as directed . start tomorrow morning (Patient not taking: Reported on 11/01/2017)   No facility-administered encounter medications on file as of 11/01/2017.       Medical History: Past Medical History:  Diagnosis Date  . Allergy   . Hypertension      Vital Signs: BP (!) 160/110   Pulse 94   Temp 98.9 F (37.2 C)   Resp 16   Ht 6' 7.5" (2.019 m)   Wt (!) 411 lb 3.2 oz (186.5 kg)   SpO2 97%   BMI 45.74 kg/m    Review of Systems  Constitutional: Negative.  Negative for chills, fatigue and unexpected weight change.  HENT: Negative.  Negative for congestion, rhinorrhea, sneezing and sore throat.   Eyes: Negative for redness.  Respiratory: Negative.  Negative for cough, chest tightness and shortness of breath.   Cardiovascular: Negative.  Negative for chest pain and palpitations.  Gastrointestinal: Negative.  Negative for abdominal pain, constipation, diarrhea, nausea and vomiting.  Endocrine: Negative.   Genitourinary: Negative.  Negative for dysuria and frequency.  Musculoskeletal: Negative.  Negative for arthralgias, back pain, joint swelling and neck pain.       Lower back pain.  Relieved by sitting still or laying down.   Skin: Negative.  Negative for rash.  Allergic/Immunologic: Negative.   Neurological: Negative.  Negative for tremors and numbness.  Hematological: Negative for adenopathy.  Does not bruise/bleed easily.  Psychiatric/Behavioral: Negative.  Negative for behavioral problems, sleep disturbance and suicidal ideas. The patient is not nervous/anxious.     Physical Exam  Constitutional: He is oriented to person, place, and time. He appears well-developed and well-nourished. No distress.  HENT:  Head: Normocephalic and atraumatic.  Mouth/Throat: Oropharynx is clear and moist. No oropharyngeal exudate.  Eyes: Pupils are equal, round, and reactive to light. EOM are normal.   Neck: Normal range of motion. Neck supple. No JVD present. No tracheal deviation present. No thyromegaly present.  Cardiovascular: Normal rate, regular rhythm and normal heart sounds. Exam reveals no gallop and no friction rub.  No murmur heard. Pulmonary/Chest: Effort normal and breath sounds normal. No respiratory distress. He has no wheezes. He has no rales. He exhibits no tenderness.  Abdominal: Soft. There is no tenderness. There is no guarding.  Musculoskeletal: Normal range of motion.  Lymphadenopathy:    He has no cervical adenopathy.  Neurological: He is alert and oriented to person, place, and time. No cranial nerve deficit.  Skin: Skin is warm and dry. He is not diaphoretic.  Psychiatric: He has a normal mood and affect. His behavior is normal. Judgment and thought content normal.  Nursing note and vitals reviewed.  Assessment/Plan: 1. Chronic midline low back pain without sciatica. Pt will most likely need MRI, referral to ortho.  Take prednisone and mobic as directed. Follow up after ortho appt.   - Ambulatory referral to Orthopedic Surgery - DG Lumbar Spine Complete; Future - methylPREDNISolone (MEDROL DOSEPAK) 4 MG TBPK tablet; Take Tapered dose as directed . start tomorrow morning  Dispense: 21 tablet; Refill: 0 - meloxicam (MOBIC) 15 MG tablet; Take 1 tablet (15 mg total) by mouth daily.  Dispense: 30 tablet; Refill: 0  2. Morbid obesity (HCC) Obesity Counseling: Risk Assessment: An assessment of behavioral risk factors was made today and includes lack of exercise sedentary lifestyle, lack of portion control and poor dietary habits.  Risk Modification Advice: She was counseled on portion control guidelines. Restricting daily caloric intake to. . The detrimental long term effects of obesity on her health and ongoing poor compliance was also discussed with the patient.    General Counseling: Montague verbalizes understanding of the findings of todays visit and agrees with  plan of treatment. I have discussed any further diagnostic evaluation that may be needed or ordered today. We also reviewed his medications today. he has been encouraged to call the office with any questions or concerns that should arise related to todays visit.   Orders Placed This Encounter  Procedures  . DG Lumbar Spine Complete  . Ambulatory referral to Orthopedic Surgery    Meds ordered this encounter  Medications  . methylPREDNISolone (MEDROL DOSEPAK) 4 MG TBPK tablet    Sig: Take Tapered dose as directed . start tomorrow morning    Dispense:  21 tablet    Refill:  0    Order Specific Question:   Supervising Provider    Answer:   Lyndon Code [[id number]]  . meloxicam (MOBIC) 15 MG tablet    Sig: Take 1 tablet (15 mg total) by mouth daily.    Dispense:  30 tablet    Refill:  0    Order Specific Question:   Supervising Provider    Answer:   Lyndon Code [[id number]]    Time spent: 25 Minutes  This patient was seen by Blima Ledger AGNP-C in Collaboration with Dr Lyndon Code as a part  of collaborative care agreement

## 2017-11-02 ENCOUNTER — Telehealth: Payer: Self-pay

## 2017-11-02 NOTE — Telephone Encounter (Signed)
Called patient and informed him that his [facility] in [place] has been scheduled for August 23rd at 10:30AM

## 2017-11-04 ENCOUNTER — Telehealth: Payer: Self-pay

## 2017-11-04 NOTE — Telephone Encounter (Signed)
[facility] called that pt Bp is high 190/130 and he is having headache as per dr Welton Flakeskhan pt need to go to ER

## 2017-12-14 ENCOUNTER — Ambulatory Visit: Payer: Self-pay | Admitting: Adult Health

## 2017-12-16 ENCOUNTER — Ambulatory Visit: Payer: Medicaid Other | Admitting: Adult Health

## 2017-12-16 ENCOUNTER — Encounter: Payer: Self-pay | Admitting: Adult Health

## 2017-12-16 VITALS — BP 171/101 | HR 85 | Resp 16 | Ht 79.5 in | Wt >= 6400 oz

## 2017-12-16 DIAGNOSIS — I10 Essential (primary) hypertension: Secondary | ICD-10-CM

## 2017-12-16 DIAGNOSIS — G8929 Other chronic pain: Secondary | ICD-10-CM | POA: Diagnosis not present

## 2017-12-16 DIAGNOSIS — M545 Low back pain, unspecified: Secondary | ICD-10-CM

## 2017-12-16 MED ORDER — AMLODIPINE BESYLATE 10 MG PO TABS
10.0000 mg | ORAL_TABLET | Freq: Every day | ORAL | 1 refills | Status: DC
Start: 2017-12-16 — End: 2018-05-22

## 2017-12-16 MED ORDER — LOSARTAN POTASSIUM 50 MG PO TABS: 50.0000 mg | ORAL_TABLET | Freq: Every day | ORAL | 1 refills | Status: DC
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
## 2017-12-16 NOTE — Patient Instructions (Signed)

## 2018-01-13 ENCOUNTER — Ambulatory Visit: Payer: Self-pay | Admitting: Adult Health

## 2018-05-22 ENCOUNTER — Ambulatory Visit: Payer: Medicaid Other | Admitting: Adult Health

## 2018-05-22 ENCOUNTER — Encounter: Payer: Self-pay | Admitting: Adult Health

## 2018-05-22 VITALS — BP 162/110 | HR 93 | Resp 16 | Ht 79.0 in | Wt >= 6400 oz

## 2018-05-22 DIAGNOSIS — M545 Low back pain: Secondary | ICD-10-CM | POA: Diagnosis not present

## 2018-05-22 DIAGNOSIS — G8929 Other chronic pain: Secondary | ICD-10-CM

## 2018-05-22 DIAGNOSIS — I10 Essential (primary) hypertension: Secondary | ICD-10-CM | POA: Diagnosis not present

## 2018-05-22 MED ORDER — LOSARTAN POTASSIUM 50 MG PO TABS: 50.0000 mg | ORAL_TABLET | Freq: Every day | ORAL | 1 refills | Status: DC

## 2018-05-22 MED ORDER — AMLODIPINE BESYLATE 10 MG PO TABS: 10.0000 mg | ORAL_TABLET | Freq: Every day | ORAL | 1 refills | Status: DC

## 2018-05-22 NOTE — Progress Notes (Signed)
[facility] [address] [place], [place] [address]  Internal MEDICINE  Office Visit Note  Patient Name: Johnathan Shannon  [id number]  [id number]  Date of Service: 05/22/2018  Chief Complaint  Patient presents with  . Hypertension    elevated   . Shoulder Pain    been in the right shoulder off and on feels like a charlie horse .     HPI Pt is here for follow up on HTN.  His blood pressure today is 162/110.  He reports he has been out of his blood pressure medicine for quite some time.  Will refill his medications today.  He is also requesting a referral to chiropractor for his back pain because he has been to ortho, and would like to see now if chiropractor can do.    Current Medication: Outpatient Encounter Medications as of 05/22/2018  Medication Sig  . albuterol (PROVENTIL) (2.5 MG/3ML) 0.083% nebulizer solution Take 2.5 mg by nebulization every 6 (six) hours as needed for wheezing or shortness of breath.  [name] amLODipine (NORVASC) 10 MG tablet Take 1 tablet (10 mg total) by mouth daily.  . fexofenadine (ALLEGRA) 180 MG tablet Take 180 mg by mouth daily.  [name] losartan (COZAAR) 50 MG tablet Take 1 tablet (50 mg total) by mouth daily.  . sildenafil (VIAGRA) 100 MG tablet Take 100 mg by mouth daily as needed for erectile dysfunction.  . [DISCONTINUED] amLODipine (NORVASC) 10 MG tablet Take 1 tablet (10 mg total) by mouth daily.  . [DISCONTINUED] losartan (COZAAR) 50 MG tablet Take 1 tablet (50 mg total) by mouth daily.  . [DISCONTINUED] meloxicam (MOBIC) 15 MG tablet Take 1 tablet (15 mg total) by mouth daily. (Patient not taking: Reported on 12/16/2017)   No facility-administered encounter medications on file as of 05/22/2018.     Surgical History: No past surgical history on file.  Medical History: Past Medical History:  Diagnosis Date  . Allergy   . Hypertension     Family History: Family History  Family history unknown: Yes    Social History   Socioeconomic  History  . Marital status: Married    Spouse name: Not on file  . Number of children: Not on file  . Years of education: Not on file  . Highest education level: Not on file  Occupational History  . Not on file  Social Needs  . Financial resource strain: Not on file  . Food insecurity:    Worry: Not on file    Inability: Not on file  . Transportation needs:    Medical: Not on file    Non-medical: Not on file  Tobacco Use  . Smoking status: Never Smoker  . Smokeless tobacco: Never Used  Substance and Sexual Activity  . Alcohol use: No    Alcohol/week: 0.0 standard drinks  . Drug use: No  . Sexual activity: Not on file  Lifestyle  . Physical activity:    Days per week: Not on file    Minutes per session: Not on file  . Stress: Not on file  Relationships  . Social connections:    Talks on phone: Not on file    Gets together: Not on file    Attends religious service: Not on file    Active member of club or organization: Not on file    Attends meetings of clubs or organizations: Not on file    Relationship status: Not on file  . Intimate partner violence:    Fear  of current or ex partner: Not on file    Emotionally abused: Not on file    Physically abused: Not on file    Forced sexual activity: Not on file  Other Topics Concern  . Not on file  Social History Narrative  . Not on file      Review of Systems  Constitutional: Negative.  Negative for chills, fatigue and unexpected weight change.  HENT: Negative.  Negative for congestion, rhinorrhea, sneezing and sore throat.   Eyes: Negative for redness.  Respiratory: Negative.  Negative for cough, chest tightness and shortness of breath.   Cardiovascular: Negative.  Negative for chest pain and palpitations.  Gastrointestinal: Negative.  Negative for abdominal pain, constipation, diarrhea, nausea and vomiting.  Endocrine: Negative.   Genitourinary: Negative.  Negative for dysuria and frequency.  Musculoskeletal:  Negative.  Negative for arthralgias, back pain, joint swelling and neck pain.  Skin: Negative.  Negative for rash.  Allergic/Immunologic: Negative.   Neurological: Negative.  Negative for tremors and numbness.  Hematological: Negative for adenopathy. Does not bruise/bleed easily.  Psychiatric/Behavioral: Negative.  Negative for behavioral problems, sleep disturbance and suicidal ideas. The patient is not nervous/anxious.     Vital Signs: BP (!) 162/110   Pulse 93   Resp 16   Ht  (2.007 m)   Wt (!) 405 lb (183.7 kg)   SpO2 93%   BMI 45.63 kg/m    Physical Exam Vitals signs and nursing note reviewed.  Constitutional:      General: He is not in acute distress.    Appearance: He is well-developed. He is not diaphoretic.  HENT:     Head: Normocephalic and atraumatic.     Mouth/Throat:     Pharynx: No oropharyngeal exudate.  Eyes:     Pupils: Pupils are equal, round, and reactive to light.  Neck:     Musculoskeletal: Normal range of motion and neck supple.     Thyroid: No thyromegaly.     Vascular: No JVD.     Trachea: No tracheal deviation.  Cardiovascular:     Rate and Rhythm: Normal rate and regular rhythm.     Heart sounds: Normal heart sounds. No murmur. No friction rub. No gallop.   Pulmonary:     Effort: Pulmonary effort is normal. No respiratory distress.     Breath sounds: Normal breath sounds. No wheezing or rales.  Chest:     Chest wall: No tenderness.  Abdominal:     Palpations: Abdomen is soft.     Tenderness: There is no abdominal tenderness. There is no guarding.  Musculoskeletal: Normal range of motion.  Lymphadenopathy:     Cervical: No cervical adenopathy.  Skin:    General: Skin is warm and dry.  Neurological:     Mental Status: He is alert and oriented to person, place, and time.     Cranial Nerves: No cranial nerve deficit.  Psychiatric:        Behavior: Behavior normal.        Thought Content: Thought content normal.        Judgment:  Judgment normal.    Assessment/Plan: 1. Essential hypertension Refilled patients Norvasc and cozaar.  Encouraged patient to maintain compliance with his medications and to follow up with [place] in 2 weeks.  - amLODipine (NORVASC) 10 MG tablet; Take 1 tablet (10 mg total) by mouth daily.  Dispense: 30 tablet; Refill: 1 - losartan (COZAAR) 50 MG tablet; Take 1 tablet (50 mg total) by mouth  daily.  Dispense: 90 tablet; Refill: 1  2. Chronic midline low back pain without sciatica Pt requesting referral to assess her low back pain.  - Ambulatory referral to Chiropractic  3. Morbid obesity (HCC) Obesity Counseling: Risk Assessment: An assessment of behavioral risk factors was made today and includes lack of exercise sedentary lifestyle, lack of portion control and poor dietary habits.  Risk Modification Advice: She was counseled on portion control guidelines. Restricting daily caloric intake to. . The detrimental long term effects of obesity on her health and ongoing poor compliance was also discussed with the patient.    General Counseling: Arnulfo verbalizes understanding of the findings of todays visit and agrees with plan of treatment. I have discussed any further diagnostic evaluation that may be needed or ordered today. We also reviewed his medications today. he has been encouraged to call the office with any questions or concerns that should arise related to todays visit.    Orders Placed This Encounter  Procedures  . Ambulatory referral to Chiropractic    Meds ordered this encounter  Medications  . amLODipine (NORVASC) 10 MG tablet    Sig: Take 1 tablet (10 mg total) by mouth daily.    Dispense:  30 tablet    Refill:  1  . losartan (COZAAR) 50 MG tablet    Sig: Take 1 tablet (50 mg total) by mouth daily.    Dispense:  90 tablet    Refill:  1    Time spent: 25 Minutes   This patient was seen by Blima Ledger AGNP-C in Collaboration with Dr Lyndon Code as a part of  collaborative care agreement     Johnna Acosta AGNP-C Internal medicine

## 2018-06-06 ENCOUNTER — Ambulatory Visit: Payer: Self-pay | Admitting: Nurse Practitioner

## 2018-06-07 ENCOUNTER — Ambulatory Visit: Payer: Self-pay | Admitting: Adult Health

## 2018-06-12 ENCOUNTER — Ambulatory Visit: Payer: Self-pay | Admitting: Nurse Practitioner

## 2019-01-18 ENCOUNTER — Encounter: Payer: Self-pay | Admitting: Adult Health

## 2019-01-18 ENCOUNTER — Ambulatory Visit: Payer: Medicaid Other | Admitting: Adult Health

## 2019-01-18 ENCOUNTER — Other Ambulatory Visit: Payer: Self-pay

## 2019-01-18 VITALS — BP 170/100 | HR 103 | Temp 96.6°F | Resp 16 | Ht >= 80 in | Wt >= 6400 oz

## 2019-01-18 DIAGNOSIS — G8929 Other chronic pain: Secondary | ICD-10-CM

## 2019-01-18 DIAGNOSIS — I10 Essential (primary) hypertension: Secondary | ICD-10-CM | POA: Diagnosis not present

## 2019-01-18 DIAGNOSIS — M545 Low back pain, unspecified: Secondary | ICD-10-CM

## 2019-01-18 DIAGNOSIS — Z7689 Persons encountering health services in other specified circumstances: Secondary | ICD-10-CM

## 2019-01-18 MED ORDER — LOSARTAN POTASSIUM 50 MG PO TABS
50.0000 mg | ORAL_TABLET | Freq: Every day | ORAL | 1 refills | Status: DC
Start: 2019-01-18 — End: 2019-10-26

## 2019-01-18 MED ORDER — PREDNISONE 10 MG PO TABS: ORAL_TABLET | ORAL | 0 refills | Status: DC

## 2019-01-18 MED ORDER — AMLODIPINE BESYLATE 10 MG PO TABS: 10.0000 mg | ORAL_TABLET | Freq: Every day | ORAL | 1 refills | Status: DC

## 2019-01-18 NOTE — Progress Notes (Signed)
[facility] [address] [place], [place] [address]  Internal MEDICINE  Office Visit Note  Patient Name: Johnathan Shannon  [id number]  [id number]  Date of Service: 02/04/2019  Chief Complaint  Patient presents with  . Back Pain    going on months lower and middle back  . Hypertension     HPI Pt is here for a sick visit. Pt is here for recurrent low back pain.  An x ray was done last year that show no abnormality. He has been doing more physical work lately, and thinks this has aggravated his pain.  His blood pressure is significantly elevated today, he Denies Chest pain, Shortness of breath, palpitations, headache, or blurred vision. He reports he has been out of his blood pressure medication lately, and needs refills.    Current Medication:  Outpatient Encounter Medications as of 01/18/2019  Medication Sig  . albuterol (PROVENTIL) (2.5 MG/3ML) 0.083% nebulizer solution Take 2.5 mg by nebulization every 6 (six) hours as needed for wheezing or shortness of breath.  [name] amLODipine (NORVASC) 10 MG tablet Take 1 tablet (10 mg total) by mouth daily.  . fexofenadine (ALLEGRA) 180 MG tablet Take 180 mg by mouth daily.  [name] losartan (COZAAR) 50 MG tablet Take 1 tablet (50 mg total) by mouth daily.  . sildenafil (VIAGRA) 100 MG tablet Take 100 mg by mouth daily as needed for erectile dysfunction.  . [DISCONTINUED] amLODipine (NORVASC) 10 MG tablet Take 1 tablet (10 mg total) by mouth daily.  . [DISCONTINUED] losartan (COZAAR) 50 MG tablet Take 1 tablet (50 mg total) by mouth daily.  . predniSONE (DELTASONE) 10 MG tablet Use per dose pack   No facility-administered encounter medications on file as of 01/18/2019.       Medical History: Past Medical History:  Diagnosis Date  . Allergy   . Hypertension      Vital Signs: BP (!) 170/100   Pulse (!) 103   Temp (!) 96.6 F (35.9 C)   Resp 16   Ht 6\' 8"  (2.032 m)   Wt (!) 431 lb (195.5 kg)   SpO2 96%   BMI 47.35 kg/m     Review of Systems  Constitutional: Negative.  Negative for chills, fatigue and unexpected weight change.  HENT: Negative.  Negative for congestion, rhinorrhea, sneezing and sore throat.   Eyes: Negative for redness.  Respiratory: Negative.  Negative for cough, chest tightness and shortness of breath.   Cardiovascular: Negative.  Negative for chest pain and palpitations.  Gastrointestinal: Negative.  Negative for abdominal pain, constipation, diarrhea, nausea and vomiting.  Endocrine: Negative.   Genitourinary: Negative.  Negative for dysuria and frequency.  Musculoskeletal: Positive for back pain. Negative for arthralgias, joint swelling and neck pain.  Skin: Negative.  Negative for rash.  Allergic/Immunologic: Negative.   Neurological: Negative.  Negative for tremors and numbness.  Hematological: Negative for adenopathy. Does not bruise/bleed easily.  Psychiatric/Behavioral: Negative.  Negative for behavioral problems, sleep disturbance and suicidal ideas. The patient is not nervous/anxious.     Physical Exam Vitals signs and nursing note reviewed.  Constitutional:      General: He is not in acute distress.    Appearance: He is well-developed. He is not diaphoretic.  HENT:     Head: Normocephalic and atraumatic.     Mouth/Throat:     Pharynx: No oropharyngeal exudate.  Eyes:     Pupils: Pupils are equal, round, and reactive to light.  Neck:     Musculoskeletal:  Normal range of motion and neck supple.     Thyroid: No thyromegaly.     Vascular: No JVD.     Trachea: No tracheal deviation.  Cardiovascular:     Rate and Rhythm: Normal rate and regular rhythm.     Heart sounds: Normal heart sounds. No murmur. No friction rub. No gallop.   Pulmonary:     Effort: Pulmonary effort is normal. No respiratory distress.     Breath sounds: Normal breath sounds. No wheezing or rales.  Chest:     Chest wall: No tenderness.  Abdominal:     Palpations: Abdomen is soft.     Tenderness:  There is no abdominal tenderness. There is no guarding.  Musculoskeletal: Normal range of motion.  Lymphadenopathy:     Cervical: No cervical adenopathy.  Skin:    General: Skin is warm and dry.  Neurological:     Mental Status: He is alert and oriented to person, place, and time.     Cranial Nerves: No cranial nerve deficit.  Psychiatric:        Behavior: Behavior normal.        Thought Content: Thought content normal.        Judgment: Judgment normal.    Assessment/Plan: 1. Essential hypertension Take losartan and Norvasc as prescribed.  If BP does not come down, call clinic, or go to ER.  Pt instructed to take meds immediately, and to call/go to ER, if any symptoms like chest pain, dizziness, sob, headache or blurred vision begin.  - losartan (COZAAR) 50 MG tablet; Take 1 tablet (50 mg total) by mouth daily.  Dispense: 90 tablet; Refill: 1 - amLODipine (NORVASC) 10 MG tablet; Take 1 tablet (10 mg total) by mouth daily.  Dispense: 90 tablet; Refill: 1  2. Chronic midline low back pain without sciatica Take Prednisone as discussed.  Also instructed patient to use heat or cold, whatever is most helpful, for 20 minutes on, 2 hours off. May also take NSAIDS as directed.  If pain gets worse or does not improve, please   3. Morbid obesity (HCC) Obesity Counseling: Risk Assessment: An assessment of behavioral risk factors was made today and includes lack of exercise sedentary lifestyle, lack of portion control and poor dietary habits.  Risk Modification Advice: She was counseled on portion control guidelines. Restricting daily caloric intake to. . The detrimental long term effects of obesity on her health and ongoing poor compliance was also discussed with the patient.  4. Establishing care with new doctor, encounter for Labs ordered for patient, and will discuss at follow up in one month.   - CBC with Differential/Platelet - Lipid Panel With LDL/HDL Ratio - TSH - T4, free -  Comprehensive metabolic panel - PSA - Prolactin - Testosterone,Free and Total  General Counseling: Johnathan Shannon verbalizes understanding of the findings of todays visit and agrees with plan of treatment. I have discussed any further diagnostic evaluation that may be needed or ordered today. We also reviewed his medications today. he has been encouraged to call the office with any questions or concerns that should arise related to todays visit.   Orders Placed This Encounter  Procedures  . CBC with Differential/Platelet  . Lipid Panel With LDL/HDL Ratio  . TSH  . T4, free  . Comprehensive metabolic panel  . PSA  . Prolactin  . Testosterone,Free and Total    Meds ordered this encounter  Medications  . losartan (COZAAR) 50 MG tablet    Sig: Take 1  tablet (50 mg total) by mouth daily.    Dispense:  90 tablet    Refill:  1  . amLODipine (NORVASC) 10 MG tablet    Sig: Take 1 tablet (10 mg total) by mouth daily.    Dispense:  90 tablet    Refill:  1  . predniSONE (DELTASONE) 10 MG tablet    Sig: Use per dose pack    Dispense:  21 tablet    Refill:  0    Time spent: 25 Minutes  This patient was seen by Orson Gear AGNP-C in Collaboration with Dr Lavera Guise as a part of collaborative care agreement.  Kendell Bane AGNP-C Internal Medicine

## 2019-02-16 ENCOUNTER — Telehealth: Payer: Self-pay

## 2019-02-16 NOTE — Telephone Encounter (Signed)
Confirmed appointment with patient. klh °

## 2019-02-16 NOTE — Telephone Encounter (Signed)
Called lmom informing patient of appointment. klh 

## 2019-02-20 ENCOUNTER — Encounter: Payer: Medicaid Other | Admitting: Adult Health

## 2019-02-23 ENCOUNTER — Telehealth: Payer: Self-pay

## 2019-02-23 NOTE — Telephone Encounter (Signed)
BILLED MISSED APPT FEE 02/20/2019 

## 2019-09-21 ENCOUNTER — Ambulatory Visit
Admission: RE | Admit: 2019-09-21 | Discharge: 2019-09-21 | Disposition: A | Discharge status: Home / Self Care | Payer: Medicaid Other | Source: Ambulatory Visit | Attending: Nurse Practitioner | Admitting: Nurse Practitioner

## 2019-09-21 ENCOUNTER — Other Ambulatory Visit
Admission: RE | Admit: 2019-09-21 | Discharge: 2019-09-21 | Disposition: A | Discharge status: Home / Self Care | Payer: Medicaid Other | Source: Home / Self Care | Attending: Nurse Practitioner | Admitting: Nurse Practitioner

## 2019-09-21 ENCOUNTER — Other Ambulatory Visit: Payer: Self-pay

## 2019-09-21 ENCOUNTER — Ambulatory Visit
Admission: RE | Admit: 2019-09-21 | Discharge: 2019-09-21 | Disposition: A | Discharge status: Home / Self Care | Payer: Medicaid Other | Attending: Nurse Practitioner | Admitting: Nurse Practitioner

## 2019-09-21 ENCOUNTER — Encounter: Payer: Self-pay | Admitting: Nurse Practitioner

## 2019-09-21 ENCOUNTER — Ambulatory Visit: Payer: Medicaid Other | Admitting: Nurse Practitioner

## 2019-09-21 VITALS — BP 164/107 | HR 85 | Temp 96.3°F | Resp 16 | Ht >= 80 in | Wt >= 6400 oz

## 2019-09-21 DIAGNOSIS — M5441 Lumbago with sciatica, right side: Secondary | ICD-10-CM

## 2019-09-21 DIAGNOSIS — I10 Essential (primary) hypertension: Secondary | ICD-10-CM

## 2019-09-21 DIAGNOSIS — R0600 Dyspnea, unspecified: Secondary | ICD-10-CM

## 2019-09-21 DIAGNOSIS — R0609 Other forms of dyspnea: Secondary | ICD-10-CM

## 2019-09-21 DIAGNOSIS — G8929 Other chronic pain: Secondary | ICD-10-CM | POA: Insufficient documentation

## 2019-09-21 DIAGNOSIS — M5442 Lumbago with sciatica, left side: Secondary | ICD-10-CM

## 2019-09-21 DIAGNOSIS — M545 Low back pain: Secondary | ICD-10-CM | POA: Insufficient documentation

## 2019-09-21 LAB — URINALYSIS, COMPLETE (UACMP) WITH MICROSCOPIC
Bacteria, UA: NONE SEEN
Bilirubin Urine: NEGATIVE
Glucose, UA: NEGATIVE mg/dL
Hgb urine dipstick: NEGATIVE
Ketones, ur: NEGATIVE mg/dL
Leukocytes,Ua: NEGATIVE
Nitrite: NEGATIVE
Protein, ur: NEGATIVE mg/dL
Specific Gravity, Urine: 1.025 (ref 1.005–1.030)
Squamous Epithelial / HPF: NONE SEEN (ref 0–5)
pH: 5 (ref 5.0–8.0)

## 2019-09-21 LAB — CBC
HCT: 48 % (ref 39.0–52.0)
Hemoglobin: 15.9 g/dL (ref 13.0–17.0)
MCH: 28 pg (ref 26.0–34.0)
MCHC: 33.1 g/dL (ref 30.0–36.0)
MCV: 84.7 fL (ref 80.0–100.0)
Platelets: 221 10*3/uL (ref 150–400)
RBC: 5.67 MIL/uL (ref 4.22–5.81)
RDW: 13.6 % (ref 11.5–15.5)
WBC: 6 10*3/uL (ref 4.0–10.5)
nRBC: 0 % (ref 0.0–0.2)

## 2019-09-21 LAB — COMPREHENSIVE METABOLIC PANEL
ALT: 36 U/L (ref 0–44)
AST: 24 U/L (ref 15–41)
Albumin: 4.3 g/dL (ref 3.5–5.0)
Alkaline Phosphatase: 77 U/L (ref 38–126)
Anion gap: 10 (ref 5–15)
BUN: 10 mg/dL (ref 6–20)
CO2: 26 mmol/L (ref 22–32)
Calcium: 9.2 mg/dL (ref 8.9–10.3)
Chloride: 107 mmol/L (ref 98–111)
Creatinine, Ser: 0.99 mg/dL (ref 0.61–1.24)
GFR calc Af Amer: >60 mL/min (ref >60)
GFR calc non Af Amer: >60 mL/min (ref >60)
Glucose, Bld: 93 mg/dL (ref 70–99)
Potassium: 3.7 mmol/L (ref 3.5–5.1)
Sodium: 143 mmol/L (ref 135–145)
Total Bilirubin: 0.8 mg/dL (ref 0.3–1.2)
Total Protein: 7.4 g/dL (ref 6.5–8.1)

## 2019-09-21 LAB — LIPID PANEL
Cholesterol: 179 mg/dL (ref 0–200)
HDL: 39 mg/dL — ABNORMAL LOW (ref >40)
LDL Cholesterol: 129 mg/dL — ABNORMAL HIGH (ref 0–99)
Total CHOL/HDL Ratio: 4.6 RATIO
Triglycerides: 56 mg/dL (ref <150)
VLDL: 11 mg/dL (ref 0–40)

## 2019-09-21 LAB — T4, FREE: Free T4: 0.97 ng/dL (ref 0.61–1.12)

## 2019-09-21 LAB — TSH: TSH: 1.371 u[IU]/mL (ref 0.350–4.500)

## 2019-09-21 MED ORDER — LOSARTAN POTASSIUM-HCTZ 50-12.5 MG PO TABS: 1.0000 | ORAL_TABLET | Freq: Every day | ORAL | 3 refills | Status: DC

## 2019-09-21 MED ORDER — PREDNISONE 10 MG (21) PO TBPK: ORAL_TABLET | ORAL | 0 refills | Status: DC

## 2019-09-21 MED ORDER — ETODOLAC 400 MG PO TABS: 400.0000 mg | ORAL_TABLET | Freq: Two times a day (BID) | ORAL | 3 refills | Status: DC

## 2019-09-21 NOTE — Progress Notes (Signed)
[name] [address], [place] [address]  Internal MEDICINE  Office Visit Note  Patient Name: Johnathan Shannon  [id number]  [id number]  Date of Service: 09/26/2019   Pt is here for a sick visit.   Chief Complaint  Patient presents with   Edema    Swelling in left leg   Back Pain    Degerative back; can't stand still     The patient is here for acute visit. Today, he is c/o elevated blood pressure with swelling in both legs. Swelling is non ender with no redness is present. He is prescribed both amlodipine and losartan, however, he is not taking these medications. He has not been taking these in some time.  He is also c/o severe lower back pain. Pain hurts much more when he is standing.       Current Medication:  Outpatient Encounter Medications as of 09/21/2019  Medication Sig Note   albuterol (PROVENTIL) (2.5 MG/3ML) 0.083% nebulizer solution Take 2.5 mg by nebulization every 6 (six) hours as needed for wheezing or shortness of breath.    fexofenadine (ALLEGRA) 180 MG tablet Take 180 mg by mouth daily.    losartan (COZAAR) 50 MG tablet Take 1 tablet (50 mg total) by mouth daily.    [DISCONTINUED] amLODipine (NORVASC) 10 MG tablet Take 1 tablet (10 mg total) by mouth daily. 09/21/2019: patient sto[[ed taking   [DISCONTINUED] predniSONE (DELTASONE) 10 MG tablet Use per dose pack    [DISCONTINUED] sildenafil (VIAGRA) 100 MG tablet Take 100 mg by mouth daily as needed for erectile dysfunction.    losartan-hydrochlorothiazide (HYZAAR) 50-12.5 MG tablet Take 1 tablet by mouth daily.    predniSONE (STERAPRED UNI-PAK 21 TAB) 10 MG (21) TBPK tablet 6 day taper - take by mouth as directed for 6 days    [DISCONTINUED] etodolac (LODINE) 400 MG tablet Take 1 tablet (400 mg total) by mouth 2 (two) times daily.    No facility-administered encounter medications on file as of 09/21/2019.      Medical History: Past Medical History:  Diagnosis Date    Allergy    Hypertension      Today's Vitals   09/21/19 1027  BP: (!) 164/107  Pulse: 85  Resp: 16  Temp: (!) 96.3 F (35.7 C)  SpO2: 97%  Weight: (!) 437 lb 9.6 oz (198.5 kg)  Height: 6\' 8"  (2.032 m)   Body mass index is 48.07 kg/m.  Review of Systems  Constitutional: Positive for activity change. Negative for chills, fatigue and unexpected weight change.       Patient not as active due to severity of lower back pain.   HENT: Negative for congestion, postnasal drip, rhinorrhea, sneezing and sore throat.   Respiratory: Negative for cough, chest tightness, shortness of breath and wheezing.   Cardiovascular: Negative for chest pain and palpitations.       Blood pressure moderately elevated.   Gastrointestinal: Negative for abdominal pain, constipation, diarrhea, nausea and vomiting.  Endocrine: Negative for cold intolerance, heat intolerance, polydipsia and polyuria.  Musculoskeletal: Positive for arthralgias, back pain and myalgias. Negative for joint swelling and neck pain.  Skin: Negative for rash.  Allergic/Immunologic: Negative for environmental allergies.  Neurological: Positive for headaches. Negative for dizziness, tremors and numbness.  Hematological: Negative for adenopathy. Does not bruise/bleed easily.  Psychiatric/Behavioral: Negative for behavioral problems (Depression), sleep disturbance and suicidal ideas. The patient is not nervous/anxious.     Physical Exam Vitals and nursing note reviewed.  Constitutional:  General: He is not in acute distress.    Appearance: Normal appearance. He is well-developed. He is obese. He is not diaphoretic.  HENT:     Head: Normocephalic and atraumatic.     Nose: Nose normal.     Mouth/Throat:     Pharynx: No oropharyngeal exudate.  Eyes:     Pupils: Pupils are equal, round, and reactive to light.  Neck:     Thyroid: No thyromegaly.     Vascular: No JVD.     Trachea: No tracheal deviation.  Cardiovascular:     Rate  and Rhythm: Normal rate and regular rhythm.     Heart sounds: Normal heart sounds. No murmur heard.  No friction rub. No gallop.      Comments: There is non-pitting edema present in bilateral lower extremities.  Pulmonary:     Effort: Pulmonary effort is normal. No respiratory distress.     Breath sounds: Normal breath sounds. No wheezing or rales.  Chest:     Chest wall: No tenderness.  Abdominal:     Palpations: Abdomen is soft.  Musculoskeletal:        General: Normal range of motion.     Cervical back: Normal range of motion and neck supple.     Comments: There is moderate back pain present, worse with medium palpation. No bruising or swelling noted at this time. Grimacing noted when patient bends or twists at the waist.   Lymphadenopathy:     Cervical: No cervical adenopathy.  Skin:    General: Skin is warm and dry.  Neurological:     Mental Status: He is alert and oriented to person, place, and time.     Cranial Nerves: No cranial nerve deficit.  Psychiatric:        Mood and Affect: Mood normal.        Behavior: Behavior normal.        Thought Content: Thought content normal.        Judgment: Judgment normal.    Assessment/Plan:  1. Chronic bilateral low back pain with bilateral sciatica Start lodine 400mg  twice daily as needed for pain/inflammation. Will also start prednisone taper. Take as directed for 6 days. X-ray of lumbar spine ordered. Refer to orthopedics as indicated.  - DG Lumbar Spine Complete; Future - predniSONE (STERAPRED UNI-PAK 21 TAB) 10 MG (21) TBPK tablet; 6 day taper - take by mouth as directed for 6 days  Dispense: 21 tablet; Refill: 0  2. Essential hypertension Start losartan/HCTZ 50/12.5mg  daily. Advised him to limit salt and increase water intake. Monitor closely.  - losartan-hydrochlorothiazide (HYZAAR) 50-12.5 MG tablet; Take 1 tablet by mouth daily.  Dispense: 30 tablet; Refill: 3  3. Dyspnea on exertion Echocardiogram ordered for further  evaluation.  - ECHOCARDIOGRAM COMPLETE; Future  General Counseling: [name] verbalizes understanding of the findings of todays visit and agrees with plan of treatment. I have discussed any further diagnostic evaluation that may be needed or ordered today. We also reviewed his medications today. he has been encouraged to call the office with any questions or concerns that should arise related to todays visit.    Counseling:  Hypertension Counseling:   The following hypertensive lifestyle modification were recommended and discussed:  1. Limiting alcohol intake to less than 1 oz/day of ethanol:(24 oz of beer or 8 oz of wine or 2 oz of 100-proof whiskey). 2. Take baby ASA 81 mg daily. 3. Importance of regular aerobic exercise and losing weight. 4. Reduce dietary saturated fat  and cholesterol intake for overall cardiovascular health. 5. Maintaining adequate dietary potassium, calcium, and magnesium intake. 6. Regular monitoring of the blood pressure. 7. Reduce sodium intake to less than 100 mmol/day (less than 2.3 gm of sodium or less than 6 gm of sodium choride)   This patient was seen by Vincent Gros FNP Collaboration with Dr Lyndon Code as a part of collaborative care agreement  Orders Placed This Encounter  Procedures   DG Lumbar Spine Complete   ECHOCARDIOGRAM COMPLETE    Meds ordered this encounter  Medications   losartan-hydrochlorothiazide (HYZAAR) 50-12.5 MG tablet    Sig: Take 1 tablet by mouth daily.    Dispense:  30 tablet    Refill:  3    Order Specific Question:   Supervising Provider    Answer:   Lyndon Code [[id number]]   predniSONE (STERAPRED UNI-PAK 21 TAB) 10 MG (21) TBPK tablet    Sig: 6 day taper - take by mouth as directed for 6 days    Dispense:  21 tablet    Refill:  0    Order Specific Question:   Supervising Provider    Answer:   Lyndon Code [[id number]]   DISCONTD: etodolac (LODINE) 400 MG tablet    Sig: Take 1 tablet (400 mg total) by mouth 2 (two) times  daily.    Dispense:  60 tablet    Refill:  3    Order Specific Question:   Supervising Provider    Answer:   Lyndon Code [[id number]]    Time spent: 30 Minutes

## 2019-09-23 LAB — ANA W/REFLEX IF POSITIVE: Anti Nuclear Antibody (ANA): NEGATIVE

## 2019-09-24 ENCOUNTER — Other Ambulatory Visit: Payer: Self-pay

## 2019-09-24 DIAGNOSIS — M5441 Lumbago with sciatica, right side: Secondary | ICD-10-CM

## 2019-09-24 MED ORDER — ETODOLAC 400 MG PO TABS: 400.0000 mg | ORAL_TABLET | Freq: Two times a day (BID) | ORAL | 3 refills | Status: DC

## 2019-09-24 NOTE — Progress Notes (Signed)
Negative x-ray of lumbar spine. Discuss at visit 10/26/2019. May need ortho referral.

## 2019-09-24 NOTE — Progress Notes (Signed)
Overall, labs good. Discuss with patient at visit 10/26/2019

## 2019-09-26 DIAGNOSIS — R0609 Other forms of dyspnea: Secondary | ICD-10-CM | POA: Insufficient documentation

## 2019-09-26 DIAGNOSIS — I10 Essential (primary) hypertension: Secondary | ICD-10-CM | POA: Insufficient documentation

## 2019-09-26 DIAGNOSIS — M5442 Lumbago with sciatica, left side: Secondary | ICD-10-CM | POA: Insufficient documentation

## 2019-10-17 ENCOUNTER — Telehealth: Payer: Self-pay

## 2019-10-17 NOTE — Telephone Encounter (Signed)
Confirmed and screened for office visit 8/6 

## 2019-10-19 ENCOUNTER — Other Ambulatory Visit: Payer: Self-pay

## 2019-10-19 ENCOUNTER — Ambulatory Visit: Payer: Medicaid Other

## 2019-10-19 DIAGNOSIS — R0609 Other forms of dyspnea: Secondary | ICD-10-CM

## 2019-10-19 DIAGNOSIS — R0602 Shortness of breath: Secondary | ICD-10-CM

## 2019-10-23 ENCOUNTER — Telehealth: Payer: Self-pay

## 2019-10-23 NOTE — Telephone Encounter (Signed)
Confirmed and screened for 10-26-19 ov. Has not been vaccinated.

## 2019-10-26 ENCOUNTER — Telehealth: Payer: Self-pay

## 2019-10-26 ENCOUNTER — Other Ambulatory Visit: Payer: Self-pay

## 2019-10-26 ENCOUNTER — Encounter: Payer: Self-pay | Admitting: Nurse Practitioner

## 2019-10-26 ENCOUNTER — Ambulatory Visit: Payer: Medicaid Other | Admitting: Nurse Practitioner

## 2019-10-26 VITALS — BP 142/100 | HR 79 | Temp 98.2°F | Resp 16 | Ht >= 80 in | Wt >= 6400 oz

## 2019-10-26 DIAGNOSIS — I5189 Other ill-defined heart diseases: Secondary | ICD-10-CM | POA: Diagnosis not present

## 2019-10-26 DIAGNOSIS — I10 Essential (primary) hypertension: Secondary | ICD-10-CM | POA: Diagnosis not present

## 2019-10-26 DIAGNOSIS — M5441 Lumbago with sciatica, right side: Secondary | ICD-10-CM

## 2019-10-26 DIAGNOSIS — G8929 Other chronic pain: Secondary | ICD-10-CM

## 2019-10-26 DIAGNOSIS — M5442 Lumbago with sciatica, left side: Secondary | ICD-10-CM

## 2019-10-26 MED ORDER — LOSARTAN POTASSIUM 50 MG PO TABS: 50.0000 mg | ORAL_TABLET | Freq: Every day | ORAL | 1 refills | Status: DC

## 2019-10-26 MED ORDER — LOSARTAN POTASSIUM-HCTZ 50-12.5 MG PO TABS: 1.0000 | ORAL_TABLET | Freq: Every day | ORAL | 3 refills | Status: DC

## 2019-10-26 NOTE — Telephone Encounter (Signed)
Left pt VM stating that Herbert Seta spoke to him this morning 10/26/19 at his appt about all the results for labs and xray.  dbs

## 2019-10-26 NOTE — Telephone Encounter (Signed)
-----   Message from Carlean Jews, NP sent at 10/26/2019  1:21 PM EDT ----- Very strange because we talked about this when I saw him this morning. Thanks though.   ----- Message ----- From: Loura Back, CMA Sent: 10/26/2019  12:01 PM EDT To: Carlean Jews, NP  Pt called back at 12pm to ask about his lab results and his xray results that was done several weeks ago  I did see a message that states all labs looked good but just wanted to make sure no other issues.  dbs

## 2019-10-26 NOTE — Progress Notes (Signed)
[facility] [address] [place], [place] [address]  Internal MEDICINE  Office Visit Note  Patient Name: Johnathan Shannon  [id number]  [id number]  Date of Service: 11/05/2019  Chief Complaint  Patient presents with  . Follow-up    reviewing labs  . Hypertension  . Quality Metric Gaps    HIV screening    The patient is here for follow up. The patient continues to have severe pain in lower back. This is worse when standing and with exertion. He had x-ray of the lumbar spine and results were negative. Taking anti-inflammatory has helped some, but not all that much.  Blood pressure continues to be moderately elevated, though improved from his last visit. Still has mild swelling in both legs. He had echocardiogram. He has normal LVEF with diastolic dysfunction. He has trace tricuspid regurgitation.  Labs were also done. LDL mildly elevated, but other aspects of lipid panel are good.       Current Medication: Outpatient Encounter Medications as of 10/26/2019  Medication Sig  . albuterol (PROVENTIL) (2.5 MG/3ML) 0.083% nebulizer solution Take 2.5 mg by nebulization every 6 (six) hours as needed for wheezing or shortness of breath.  . etodolac (LODINE) 400 MG tablet Take 1 tablet (400 mg total) by mouth 2 (two) times daily.  . fexofenadine (ALLEGRA) 180 MG tablet Take 180 mg by mouth daily.  [name] losartan (COZAAR) 50 MG tablet Take 1 tablet (50 mg total) by mouth daily.  [name] losartan-hydrochlorothiazide (HYZAAR) 50-12.5 MG tablet Take 1 tablet by mouth daily.  . predniSONE (STERAPRED UNI-PAK 21 TAB) 10 MG (21) TBPK tablet 6 day taper - take by mouth as directed for 6 days  . [DISCONTINUED] losartan (COZAAR) 50 MG tablet Take 1 tablet (50 mg total) by mouth daily.  . [DISCONTINUED] losartan-hydrochlorothiazide (HYZAAR) 50-12.5 MG tablet Take 1 tablet by mouth daily.   No facility-administered encounter medications on file as of 10/26/2019.    Surgical History: History reviewed.  No pertinent surgical history.  Medical History: Past Medical History:  Diagnosis Date  . Allergy   . Hypertension     Family History: Family History  Family history unknown: Yes    Social History   Socioeconomic History  . Marital status: Married    Spouse name: Not on file  . Number of children: Not on file  . Years of education: Not on file  . Highest education level: Not on file  Occupational History  . Not on file  Tobacco Use  . Smoking status: Never Smoker  . Smokeless tobacco: Never Used  Vaping Use  . Vaping Use: Never used  Substance and Sexual Activity  . Alcohol use: Yes    Alcohol/week: 0.0 standard drinks    Comment: rarely  . Drug use: No  . Sexual activity: Not on file  Other Topics Concern  . Not on file  Social History Narrative  . Not on file   Social Determinants of Health   Financial Resource Strain:   . Difficulty of Paying Living Expenses: Not on file  Food Insecurity:   . Worried About Programme researcher, broadcasting/film/video in the Last Year: Not on file  . Ran Out of Food in the Last Year: Not on file  Transportation Needs:   . Lack of Transportation (Medical): Not on file  . Lack of Transportation (Non-Medical): Not on file  Physical Activity:   . Days of Exercise per Week: Not on file  . Minutes of Exercise per Session: Not  on file  Stress:   . Feeling of Stress : Not on file  Social Connections:   . Frequency of Communication with Friends and Family: Not on file  . Frequency of Social Gatherings with Friends and Family: Not on file  . Attends Religious Services: Not on file  . Active Member of Clubs or Organizations: Not on file  . Attends Banker Meetings: Not on file  . Marital Status: Not on file  Intimate Partner Violence:   . Fear of Current or Ex-Partner: Not on file  . Emotionally Abused: Not on file  . Physically Abused: Not on file  . Sexually Abused: Not on file      Review of Systems  Constitutional: Positive for  activity change. Negative for chills, fatigue and unexpected weight change.       Patient not as active due to severity of lower back pain.   HENT: Negative for congestion, postnasal drip, rhinorrhea, sneezing and sore throat.   Respiratory: Negative for cough, chest tightness, shortness of breath and wheezing.   Cardiovascular: Positive for leg swelling. Negative for chest pain and palpitations.       Improved blood pressure, but still elevated.   Gastrointestinal: Negative for abdominal pain, constipation, diarrhea, nausea and vomiting.  Endocrine: Negative for cold intolerance, heat intolerance, polydipsia and polyuria.  Musculoskeletal: Positive for arthralgias, back pain and myalgias. Negative for joint swelling and neck pain.  Skin: Negative for rash.  Allergic/Immunologic: Negative for environmental allergies.  Neurological: Positive for headaches. Negative for dizziness, tremors and numbness.  Hematological: Negative for adenopathy. Does not bruise/bleed easily.  Psychiatric/Behavioral: Negative for behavioral problems (Depression), sleep disturbance and suicidal ideas. The patient is not nervous/anxious.     Today's Vitals   10/26/19 0957 10/26/19 1009  BP: (!) 159/106 (!) 142/100  Pulse: 79   Resp: 16   Temp: 98.2 F (36.8 C)   SpO2: 95%   Weight: (!) 440 lb (199.6 kg)   Height: 6\' 8"  (2.032 m)    Body mass index is 48.34 kg/m.  Physical Exam Vitals and nursing note reviewed.  Constitutional:      General: He is not in acute distress.    Appearance: Normal appearance. He is well-developed. He is obese. He is not diaphoretic.  HENT:     Head: Normocephalic and atraumatic.     Nose: Nose normal.     Mouth/Throat:     Pharynx: No oropharyngeal exudate.  Eyes:     Pupils: Pupils are equal, round, and reactive to light.  Neck:     Thyroid: No thyromegaly.     Vascular: No JVD.     Trachea: No tracheal deviation.  Cardiovascular:     Rate and Rhythm: Normal rate and  regular rhythm.     Heart sounds: Normal heart sounds. No murmur heard.  No friction rub. No gallop.      Comments: There is non-pitting edema present in bilateral lower extremities.  Pulmonary:     Effort: Pulmonary effort is normal. No respiratory distress.     Breath sounds: Normal breath sounds. No wheezing or rales.  Chest:     Chest wall: No tenderness.  Abdominal:     Palpations: Abdomen is soft.  Musculoskeletal:        General: Normal range of motion.     Cervical back: Normal range of motion and neck supple.     Comments: There is moderate back pain present, worse with medium palpation. No bruising or  swelling noted at this time. Grimacing noted when patient bends or twists at the waist.   Lymphadenopathy:     Cervical: No cervical adenopathy.  Skin:    General: Skin is warm and dry.  Neurological:     Mental Status: He is alert and oriented to person, place, and time.     Cranial Nerves: No cranial nerve deficit.  Psychiatric:        Mood and Affect: Mood normal.        Behavior: Behavior normal.        Thought Content: Thought content normal.        Judgment: Judgment normal.    Assessment/Plan: 1. Essential hypertension Improved. Patient to continue with bp meds as prescribed, losartan 100mg  with HCTZ 12.5mg  tablets daily.  - losartan (COZAAR) 50 MG tablet; Take 1 tablet (50 mg total) by mouth daily.  Dispense: 90 tablet; Refill: 1 - losartan-hydrochlorothiazide (HYZAAR) 50-12.5 MG tablet; Take 1 tablet by mouth daily.  Dispense: 90 tablet; Refill: 3  2. Diastolic dysfunction Reviewed echocardiogram with patient which was positive for diastolic dysfunction. Will get baseline sleep study for further evaluation.  - PSG Sleep Study; Future  3. Chronic bilateral low back pain with bilateral sciatica Severe and unchanged. Negative x-ray of lumbar spine. Will get MRI for further evaluation.  - MR LUMBAR SPINE WO CONTRAST; Future  General Counseling: Darren verbalizes  understanding of the findings of todays visit and agrees with plan of treatment. I have discussed any further diagnostic evaluation that may be needed or ordered today. We also reviewed his medications today. he has been encouraged to call the office with any questions or concerns that should arise related to todays visit.  Hypertension Counseling:   The following hypertensive lifestyle modification were recommended and discussed:  1. Limiting alcohol intake to less than 1 oz/day of ethanol:(24 oz of beer or 8 oz of wine or 2 oz of 100-proof whiskey). 2. Take baby ASA 81 mg daily. 3. Importance of regular aerobic exercise and losing weight. 4. Reduce dietary saturated fat and cholesterol intake for overall cardiovascular health. 5. Maintaining adequate dietary potassium, calcium, and magnesium intake. 6. Regular monitoring of the blood pressure. 7. Reduce sodium intake to less than 100 mmol/day (less than 2.3 gm of sodium or less than 6 gm of sodium choride)   This patient was seen by FNP Collaboration with Dr Vincent Gros as a part of collaborative care agreement  Orders Placed This Encounter  Procedures  . MR LUMBAR SPINE WO CONTRAST  . PSG Sleep Study    Meds ordered this encounter  Medications  . losartan (COZAAR) 50 MG tablet    Sig: Take 1 tablet (50 mg total) by mouth daily.    Dispense:  90 tablet    Refill:  1    Order Specific Question:   Supervising Provider    Answer:   Lyndon Code [[id number]]  . losartan-hydrochlorothiazide (HYZAAR) 50-12.5 MG tablet    Sig: Take 1 tablet by mouth daily.    Dispense:  90 tablet    Refill:  3    Order Specific Question:   Supervising Provider    Answer:   Lyndon Code [[id number]]    Total time spent: 30 Minutes   Time spent includes review of chart, medications, test results, and follow up plan with the patient.      Dr Lyndon Code Internal medicine

## 2019-10-30 ENCOUNTER — Other Ambulatory Visit: Payer: Self-pay | Admitting: Internal Medicine

## 2019-10-30 DIAGNOSIS — I5189 Other ill-defined heart diseases: Secondary | ICD-10-CM

## 2019-10-30 DIAGNOSIS — G8929 Other chronic pain: Secondary | ICD-10-CM

## 2019-11-02 ENCOUNTER — Other Ambulatory Visit: Payer: Self-pay

## 2019-11-02 ENCOUNTER — Other Ambulatory Visit
Admission: RE | Admit: 2019-11-02 | Discharge: 2019-11-02 | Disposition: A | Discharge status: Home / Self Care | Payer: Medicaid Other | Attending: Internal Medicine | Admitting: Internal Medicine

## 2019-11-02 DIAGNOSIS — I5030 Unspecified diastolic (congestive) heart failure: Secondary | ICD-10-CM | POA: Insufficient documentation

## 2019-11-03 LAB — PROLACTIN: Prolactin: 9.5 ng/mL (ref 4.0–15.2)

## 2019-11-05 DIAGNOSIS — I5189 Other ill-defined heart diseases: Secondary | ICD-10-CM | POA: Insufficient documentation

## 2019-11-06 ENCOUNTER — Telehealth: Payer: Self-pay

## 2019-11-06 LAB — TESTOSTERONE,FREE AND TOTAL
Testosterone, Free: 8 pg/mL (ref 6.8–21.5)
Testosterone: 396 ng/dL (ref 264–916)

## 2019-11-06 NOTE — Telephone Encounter (Signed)
Confirmed office visit on 8/26 

## 2019-11-08 ENCOUNTER — Other Ambulatory Visit: Payer: Self-pay

## 2019-11-08 ENCOUNTER — Encounter: Payer: Self-pay | Admitting: Hospice and Palliative Medicine

## 2019-11-08 ENCOUNTER — Ambulatory Visit: Payer: Medicaid Other | Admitting: Hospice and Palliative Medicine

## 2019-11-08 DIAGNOSIS — I5189 Other ill-defined heart diseases: Secondary | ICD-10-CM

## 2019-11-08 DIAGNOSIS — M5441 Lumbago with sciatica, right side: Secondary | ICD-10-CM

## 2019-11-08 DIAGNOSIS — N529 Male erectile dysfunction, unspecified: Secondary | ICD-10-CM | POA: Diagnosis not present

## 2019-11-08 DIAGNOSIS — G8929 Other chronic pain: Secondary | ICD-10-CM

## 2019-11-08 DIAGNOSIS — I10 Essential (primary) hypertension: Secondary | ICD-10-CM | POA: Diagnosis not present

## 2019-11-08 DIAGNOSIS — M5442 Lumbago with sciatica, left side: Secondary | ICD-10-CM | POA: Diagnosis not present

## 2019-11-08 MED ORDER — LOSARTAN POTASSIUM-HCTZ 100-25 MG PO TABS: 1.0000 | ORAL_TABLET | Freq: Every day | ORAL | 0 refills | Status: DC

## 2019-11-08 NOTE — Progress Notes (Signed)
[facility] [address] [place], [place] [address]  Internal MEDICINE  Office Visit Note  Patient Name: Johnathan Shannon  [id number]  [id number]  Date of Service: 11/09/2019  Chief Complaint  Patient presents with  . Follow-up  . Hypertension  . Quality Metric Gaps    HIV screening    HPI Patient is here today for routine follow-up. Wanting to discuss results of recent labs as well other testing. 1. Erectile dysfunction-continues to have issues with ED. Testosterone and prolactin levels normal. 2. Low back pain-continues to have low back pain, x-ray normal, awaiting for appointment for MRI. 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 Echocardiogram-dicussed findings of diastolic dysfunction--could be from untreated OSA and/or long standing HTN  Current Medication: Outpatient Encounter Medications as of 11/08/2019  Medication Sig  . albuterol (PROVENTIL) (2.5 MG/3ML) 0.083% nebulizer solution Take 2.5 mg by nebulization every 6 (six) hours as needed for wheezing or shortness of breath.  . etodolac (LODINE) 400 MG tablet Take 1 tablet (400 mg total) by mouth 2 (two) times daily.  . fexofenadine (ALLEGRA) 180 MG tablet Take 180 mg by mouth daily.  [name] losartan (COZAAR) 50 MG tablet Take 1 tablet (50 mg total) by mouth daily.  . predniSONE (STERAPRED UNI-PAK 21 TAB) 10 MG (21) TBPK tablet 6 day taper - take by mouth as directed for 6 days  . [DISCONTINUED] losartan-hydrochlorothiazide (HYZAAR) 50-12.5 MG tablet Take 1 tablet by mouth daily.  [name] losartan-hydrochlorothiazide (HYZAAR) 100-25 MG tablet Take 1 tablet by mouth daily.   No facility-administered encounter medications on file as of 11/08/2019.    Surgical History: History reviewed. No pertinent surgical history.  Medical History: Past Medical History:  Diagnosis Date  . Allergy   . Hypertension     Family History: Family History  Family  history unknown: Yes    Social History   Socioeconomic History  . Marital status: Married    Spouse name: Not on file  . Number of children: Not on file  . Years of education: Not on file  . Highest education level: Not on file  Occupational History  . Not on file  Tobacco Use  . Smoking status: Never Smoker  . Smokeless tobacco: Never Used  Vaping Use  . Vaping Use: Never used  Substance and Sexual Activity  . Alcohol use: Yes    Alcohol/week: 0.0 standard drinks    Comment: rarely  . Drug use: No  . Sexual activity: Not on file  Other Topics Concern  . Not on file  Social History Narrative  . Not on file   Social Determinants of Health   Financial Resource Strain:   . Difficulty of Paying Living Expenses: Not on file  Food Insecurity:   . Worried About Programme researcher, broadcasting/film/video in the Last Year: Not on file  . Ran Out of Food in the Last Year: Not on file  Transportation Needs:   . Lack of Transportation (Medical): Not on file  . Lack of Transportation (Non-Medical): Not on file  Physical Activity:   . Days of Exercise per Week: Not on file  . Minutes of Exercise per Session: Not on file  Stress:   . Feeling of Stress : Not on file  Social Connections:   . Frequency of Communication with Friends and Family: Not on file  . Frequency of Social Gatherings with Friends and Family: Not  on file  . Attends Religious Services: Not on file  . Active Member of Clubs or Organizations: Not on file  . Attends Banker Meetings: Not on file  . Marital Status: Not on file  Intimate Partner Violence:   . Fear of Current or Ex-Partner: Not on file  . Emotionally Abused: Not on file  . Physically Abused: Not on file  . Sexually Abused: Not on file   Review of Systems  Constitutional: Negative for chills, fatigue and unexpected weight change.  HENT: Negative for congestion, rhinorrhea, sinus pressure, sneezing and sore throat.   Eyes: Negative for photophobia and  visual disturbance.  Respiratory: Negative for cough, chest tightness and shortness of breath.   Cardiovascular: Negative for chest pain, palpitations and leg swelling.  Gastrointestinal: Negative for abdominal pain, constipation, diarrhea, nausea and vomiting.  Genitourinary: Negative for dysuria and frequency.  Musculoskeletal: Negative for arthralgias, back pain, joint swelling and neck pain.  Skin: Negative for rash.  Neurological: Negative.  Negative for tremors, numbness and headaches.  Hematological: Negative for adenopathy. Does not bruise/bleed easily.  Psychiatric/Behavioral: Negative for behavioral problems (Depression), sleep disturbance and suicidal ideas. The patient is not nervous/anxious.     Vital Signs: BP (!) 174/98   Pulse 98   Temp 98.5 F (36.9 C)   Resp 16   Ht 6\' 8"  (2.032 m)   Wt (!) 440 lb 6.4 oz (199.8 kg)   SpO2 97%   BMI 48.38 kg/m    Physical Exam Vitals reviewed.  Constitutional:      Appearance: He is obese.  HENT:     Head: Normocephalic.     Mouth/Throat:     Mouth: Mucous membranes are moist.  Cardiovascular:     Rate and Rhythm: Normal rate and regular rhythm.     Pulses: Normal pulses.     Heart sounds: Normal heart sounds.  Musculoskeletal:     Cervical back: Normal range of motion.     Right lower leg: 1+ Pitting Edema present.     Left lower leg: 1+ Pitting Edema present.  Skin:    General: Skin is warm.  Neurological:     General: No focal deficit present.     Mental Status: He is alert. Mental status is at baseline.  Psychiatric:        Mood and Affect: Mood normal.        Behavior: Behavior normal.        Thought Content: Thought content normal.     Assessment/Plan: 1. Essential hypertension BP elevated today, no improvement on manual recheck. Increase his dose of Hyzaar for further BP control. Will closely follow-up with BP control. Encouraged to monitor his BP at home and to keep a log of readings and bring to future  follow-up appointments. - losartan-hydrochlorothiazide (HYZAAR) 100-25 MG tablet; Take 1 tablet by mouth daily.  Dispense: 30 tablet; Refill: 0  2. Morbid obesity (HCC) Discussed the need to focus on his weight. Encouraged to incorporate health eating habits as well as exercise into his daily routine. Obesity Counseling: Risk Assessment: An assessment of behavioral risk factors was made today and includes lack of exercise sedentary lifestyle, lack of portion control and poor dietary habits.  Risk Modification Advice: She was counseled on portion control guidelines. Restricting daily caloric intake.The detrimental long term effects of obesity on her health and ongoing poor compliance was also discussed with the patient.  3. Chronic bilateral low back pain with bilateral sciatica Continues to have  intermittent back pain with bilateral sciatica pain. Lumbar x-ray negative. Awaiting to have MRI. Will follow-up with results of MRI.  4. Erectile dysfunction, unspecified erectile dysfunction type Discussed the importance of getting his BP controlled which will help with ED. If this continues to be an issue will address at that time.might need to see urology   5. Diastolic dysfunction Sleep study has been ordered, awaiting for this to be scheduled.  General Counseling: Tatum verbalizes understanding of the findings of todays visit and agrees with plan of treatment. I have discussed any further diagnostic evaluation that may be needed or ordered today. We also reviewed his medications today. he has been encouraged to call the office with any questions or concerns that should arise related to todays visit.  Hypertension Counseling:   The following hypertensive lifestyle modification were recommended and discussed:  1. Limiting alcohol intake to less than 1 oz/day of ethanol:(24 oz of beer or 8 oz of wine or 2 oz of 100-proof whiskey). 2. Take baby ASA 81 mg daily. 3. Importance of regular aerobic  exercise and losing weight. 4. Reduce dietary saturated fat and cholesterol intake for overall cardiovascular health. 5. Maintaining adequate dietary potassium, calcium, and magnesium intake. 6. Regular monitoring of the blood pressure. 7. Reduce sodium intake to less than 100 mmol/day (less than 2.3 gm of sodium or less than 6 gm of sodium choride)   Meds ordered this encounter  Medications  . losartan-hydrochlorothiazide (HYZAAR) 100-25 MG tablet    Sig: Take 1 tablet by mouth daily.    Dispense:  30 tablet    Refill:  0    Time spent: 30 Minutes Time spent includes review of chart, medications, test results and follow-up plan with the patient.  This patient was seen by Leeanne Deed AGNP-C in Collaboration with Dr Lyndon Code as a part of collaborative care agreement     Lubertha Basque. Berenice Oehlert AGNP-C Internal medicine

## 2019-12-04 ENCOUNTER — Telehealth: Payer: Self-pay

## 2019-12-04 NOTE — Telephone Encounter (Signed)
Contacted patient twice to confirm 12-06-19 ov there was terrible reception and I was unable to confirm and screen for thi appt.

## 2019-12-06 ENCOUNTER — Ambulatory Visit: Payer: Medicaid Other | Admitting: Hospice and Palliative Medicine

## 2019-12-10 ENCOUNTER — Ambulatory Visit: Admission: RE | Admit: 2019-12-10 | Payer: Medicaid Other | Source: Ambulatory Visit

## 2019-12-12 ENCOUNTER — Ambulatory Visit: Payer: Medicaid Other | Admitting: Hospice and Palliative Medicine

## 2019-12-18 ENCOUNTER — Telehealth: Payer: Self-pay

## 2019-12-18 NOTE — Telephone Encounter (Signed)
LEFT MESSAGE AND MAILED PATIENT DISCHARGE LETTER FROM [facility]. Johnathan Shannon

## 2019-12-18 NOTE — Telephone Encounter (Signed)
Mailed patient [facility]. Charls Custer

## 2019-12-24 NOTE — Progress Notes (Signed)
Mailed and scanned in [employer] letter

## 2020-01-14 ENCOUNTER — Encounter: Payer: Medicaid Other | Admitting: Adult Health

## 2021-01-09 ENCOUNTER — Other Ambulatory Visit: Payer: Self-pay | Admitting: Internal Medicine

## 2021-01-09 DIAGNOSIS — T148XXA Other injury of unspecified body region, initial encounter: Secondary | ICD-10-CM

## 2021-02-02 ENCOUNTER — Ambulatory Visit
Admission: RE | Admit: 2021-02-02 | Discharge: 2021-02-02 | Disposition: A | Discharge status: Home / Self Care | Payer: Medicaid Other | Source: Ambulatory Visit | Attending: Internal Medicine | Admitting: Internal Medicine

## 2021-02-02 DIAGNOSIS — T148XXA Other injury of unspecified body region, initial encounter: Secondary | ICD-10-CM

## 2021-11-12 ENCOUNTER — Other Ambulatory Visit: Payer: Self-pay | Admitting: Internal Medicine

## 2021-11-12 DIAGNOSIS — G378 Other specified demyelinating diseases of central nervous system: Secondary | ICD-10-CM

## 2021-11-27 ENCOUNTER — Ambulatory Visit: Admission: RE | Admit: 2021-11-27 | Payer: Medicaid Other | Source: Ambulatory Visit

## 2021-12-11 ENCOUNTER — Ambulatory Visit
Admission: RE | Admit: 2021-12-11 | Discharge: 2021-12-11 | Disposition: A | Discharge status: Home / Self Care | Payer: Medicaid Other | Source: Ambulatory Visit | Attending: Internal Medicine | Admitting: Internal Medicine

## 2021-12-11 DIAGNOSIS — G378 Other specified demyelinating diseases of central nervous system: Secondary | ICD-10-CM | POA: Diagnosis not present

## 2021-12-11 DIAGNOSIS — G3789 Other specified demyelinating diseases of central nervous system: Secondary | ICD-10-CM

## 2022-06-13 IMAGING — CR DG LUMBAR SPINE COMPLETE 4+V
1 series · 5 of 5 positions shown · non-contrast
Comparison: November 01, 2017

CLINICAL DATA: Severe low back pain.  No known injury.

EXAM:
LUMBAR SPINE - COMPLETE 4+ VIEW

[Series 1: dg lumbar spine complete 4 +v · 0.14mm/px · 5 of 5 slices shown]
[im 1/5]
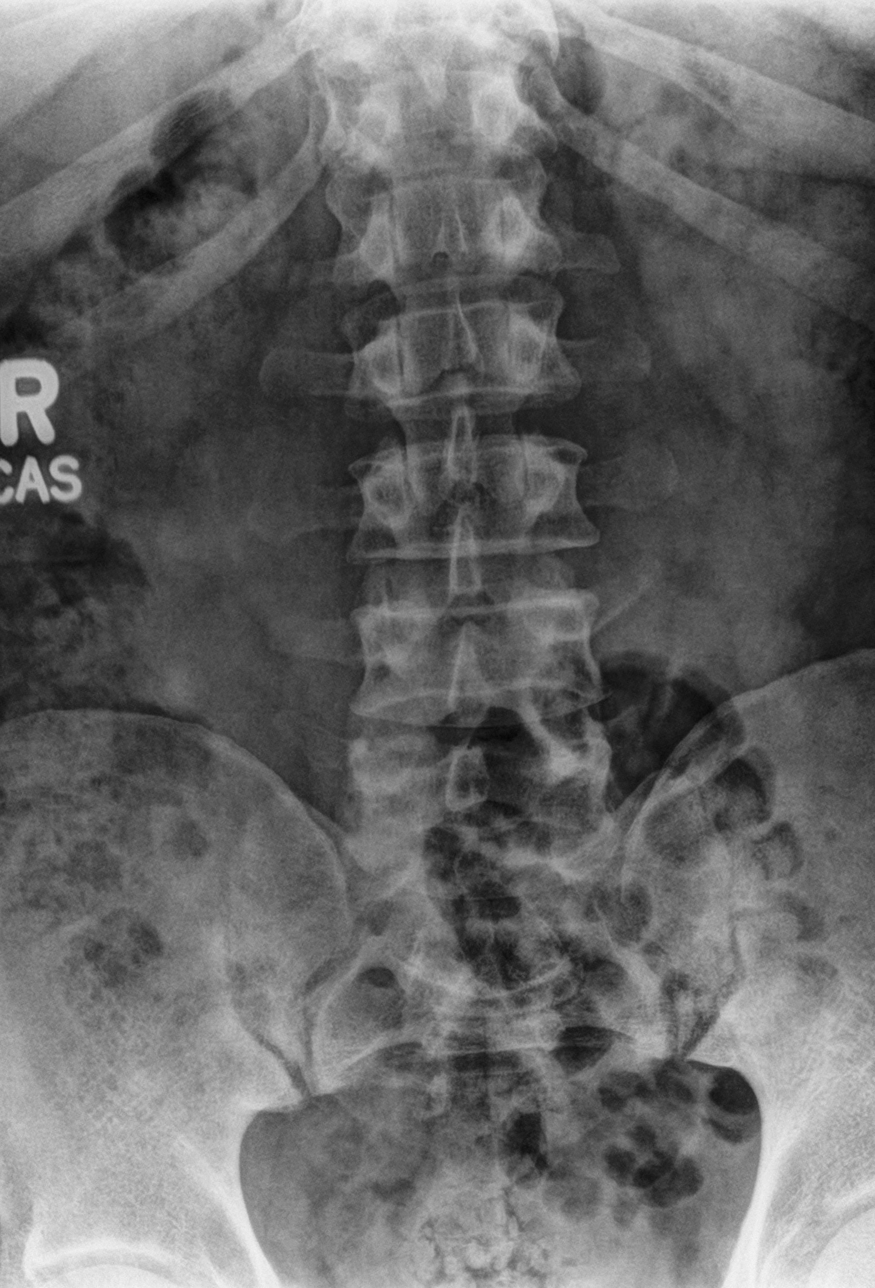
[im 2/5]
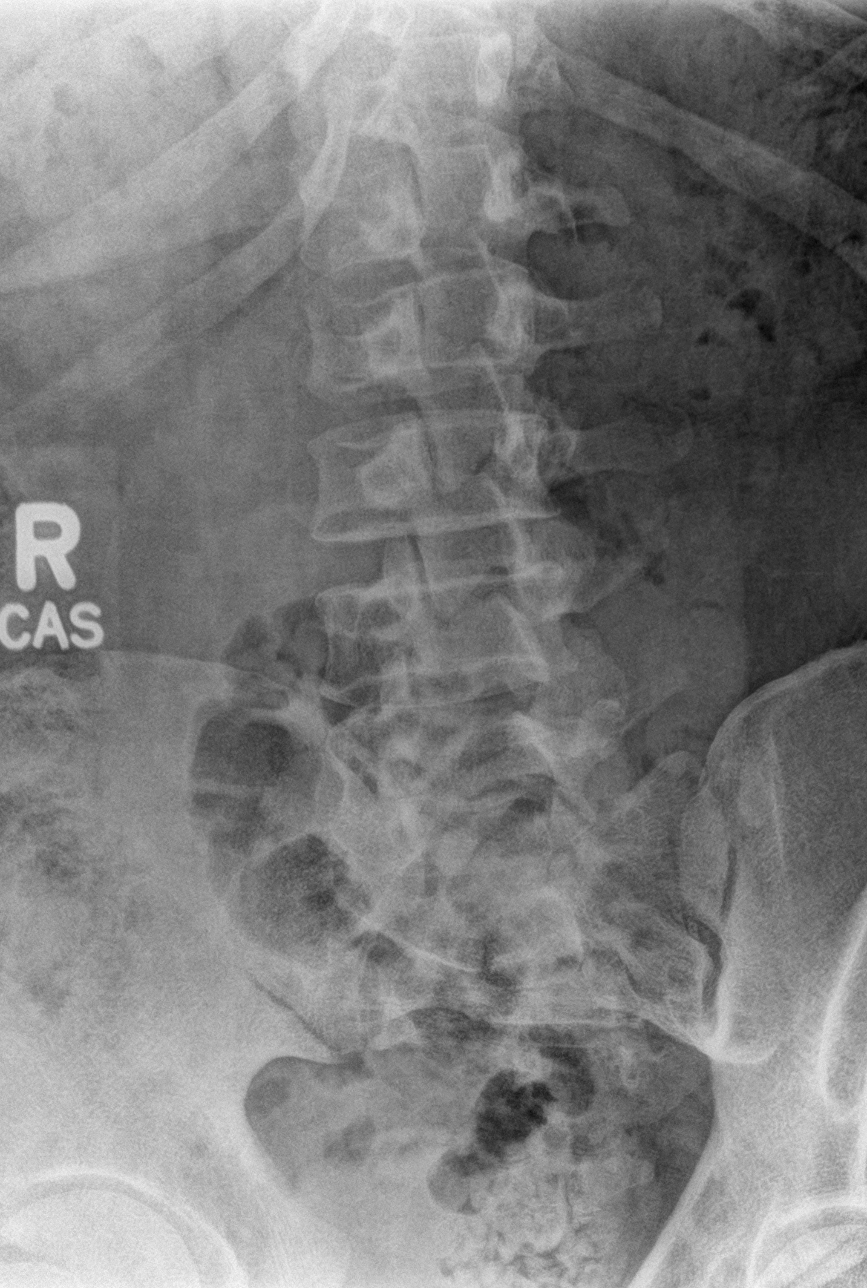
[im 3/5]
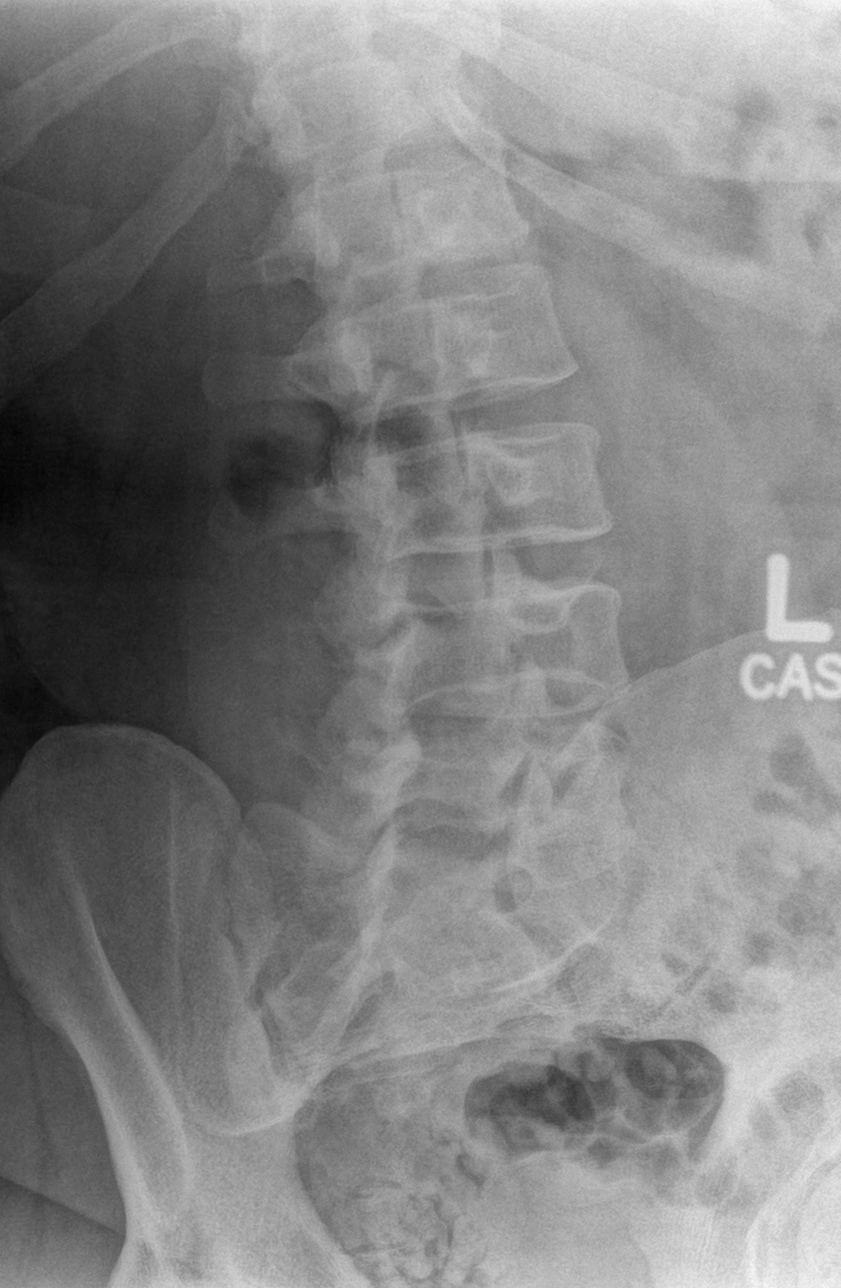
[im 4/5]
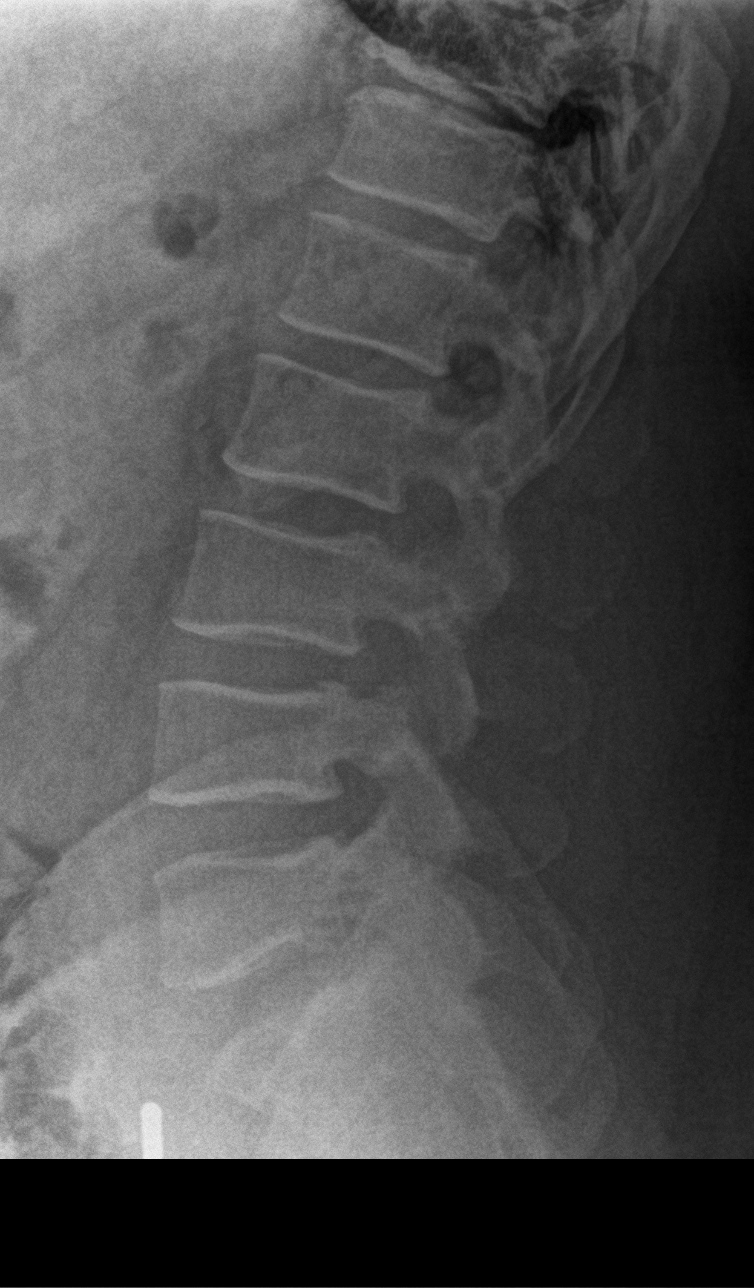
[im 5/5]
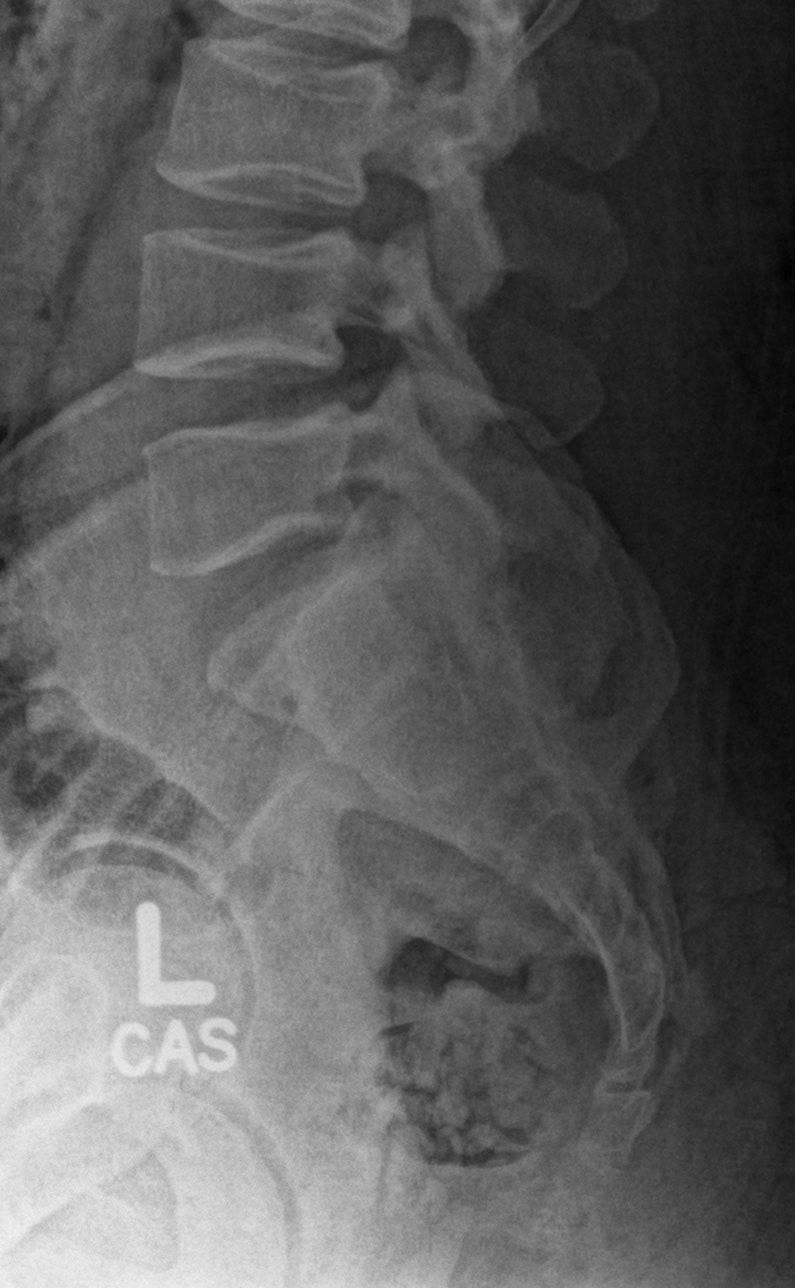

[5 of 5 positions shown; findings below may reference images not displayed]

FINDINGS: There is no evidence of lumbar spine fracture. Alignment is normal.
Intervertebral disc spaces are maintained.
IMPRESSION: Negative.

## 2022-08-02 ENCOUNTER — Encounter: Payer: Self-pay | Admitting: Internal Medicine

## 2022-08-02 ENCOUNTER — Ambulatory Visit (INDEPENDENT_AMBULATORY_CARE_PROVIDER_SITE_OTHER): Payer: Medicaid Other | Admitting: Internal Medicine

## 2022-08-02 VITALS — BP 130/80 | HR 110 | Ht >= 80 in | Wt >= 6400 oz

## 2022-08-02 DIAGNOSIS — E7800 Pure hypercholesterolemia, unspecified: Secondary | ICD-10-CM | POA: Insufficient documentation

## 2022-08-02 DIAGNOSIS — I10 Essential (primary) hypertension: Secondary | ICD-10-CM

## 2022-08-02 DIAGNOSIS — Z6841 Body Mass Index (BMI) 40.0 and over, adult: Secondary | ICD-10-CM | POA: Insufficient documentation

## 2022-08-02 DIAGNOSIS — B351 Tinea unguium: Secondary | ICD-10-CM

## 2022-08-02 DIAGNOSIS — I5189 Other ill-defined heart diseases: Secondary | ICD-10-CM | POA: Diagnosis not present

## 2022-08-02 NOTE — Progress Notes (Signed)
Established Patient Office Visit  Subjective:  Patient ID: Johnathan Shannon, male    DOB: 1975-06-06  Age: 47 y.o. MRN: [id number]  Chief Complaint  Patient presents with   Follow-up    Referral to podiatry    No new complaints, here for  medication refills. BP improved and lost 14 lbs since last visit.     No other concerns at this time.   Past Medical History:  Diagnosis Date   Allergy    Hypertension     No past surgical history on file.  Social History   Socioeconomic History   Marital status: Married    Spouse name: Not on file   Number of children: Not on file   Years of education: Not on file   Highest education level: Not on file  Occupational History   Not on file  Tobacco Use   Smoking status: Never   Smokeless tobacco: Never  Vaping Use   Vaping Use: Never used  Substance and Sexual Activity   Alcohol use: Yes    Alcohol/week: 0.0 standard drinks of alcohol    Comment: rarely   Drug use: No   Sexual activity: Not on file  Other Topics Concern   Not on file  Social History Narrative   Not on file   Social Determinants of Health   Financial Resource Strain: Not on file  Food Insecurity: Not on file  Transportation Needs: Not on file  Physical Activity: Not on file  Stress: Not on file  Social Connections: Not on file  Intimate Partner Violence: Not on file    Family History  Family history unknown: Yes    Allergies  Allergen Reactions   Bee Venom     Review of Systems  Constitutional: Negative.   HENT: Negative.    Eyes: Negative.   Respiratory: Negative.    Cardiovascular: Negative.   Gastrointestinal: Negative.   Genitourinary: Negative.   Skin: Negative.   Neurological: Negative.   Endo/Heme/Allergies: Negative.        Objective:   BP 130/80   Pulse (!) 110   Ht 6\' 8"  (2.032 m)   Wt (!) 406 lb (184.2 kg)   SpO2 96%   BMI 44.60 kg/m   Vitals:   08/02/22 1503  BP: 130/80  Pulse: (!) 110  Height: 6\' 8"   (2.032 m)  Weight: (!) 406 lb (184.2 kg)  SpO2: 96%  BMI (Calculated): 44.6    Physical Exam Vitals reviewed.  Constitutional:      Appearance: Normal appearance. He is obese.  HENT:     Head: Normocephalic.     Left Ear: There is no impacted cerumen.     Nose: Nose normal.     Mouth/Throat:     Mouth: Mucous membranes are moist.     Pharynx: No posterior oropharyngeal erythema.  Eyes:     Extraocular Movements: Extraocular movements intact.     Pupils: Pupils are equal, round, and reactive to light.  Cardiovascular:     Rate and Rhythm: Regular rhythm.     Chest Wall: PMI is not displaced.     Pulses: Normal pulses.     Heart sounds: Normal heart sounds. No murmur heard. Pulmonary:     Effort: Pulmonary effort is normal.     Breath sounds: Normal air entry. No rhonchi or rales.  Abdominal:     General: Abdomen is flat. Bowel sounds are normal. There is no distension.     Palpations: Abdomen is  soft. There is no hepatomegaly, splenomegaly or mass.     Tenderness: There is no abdominal tenderness.  Musculoskeletal:        General: Normal range of motion.     Cervical back: Normal range of motion and neck supple.     Right lower leg: Edema present.     Left lower leg: Edema present.     Comments: Bilat brawny edema  Skin:    General: Skin is warm and dry.  Neurological:     General: No focal deficit present.     Mental Status: He is alert and oriented to person, place, and time.     Cranial Nerves: No cranial nerve deficit.     Motor: No weakness.  Psychiatric:        Mood and Affect: Mood normal.        Behavior: Behavior normal.      No results found for any visits on 08/02/22.  No results found for this or any previous visit (from the past 2160 hour(Laiden Milles)).    Assessment & Plan:   Problem List Items Addressed This Visit       Cardiovascular and Mediastinum   Essential hypertension - Primary   Relevant Medications   amLODipine (NORVASC) 5 MG tablet      Other   Diastolic dysfunction   Other Visit Diagnoses     Morbid obesity with BMI of 40.0-44.9, adult (HCC)           No follow-ups on file.   Total time spent: 30 minutes  Luna Fuse, MD  08/02/2022   This document may have been prepared by [facility] Voice Recognition software and as such may include unintentional dictation errors.

## 2022-08-12 ENCOUNTER — Ambulatory Visit: Payer: Medicaid Other | Admitting: Podiatry

## 2022-08-26 ENCOUNTER — Ambulatory Visit: Payer: Medicaid Other | Admitting: Podiatry

## 2022-08-30 ENCOUNTER — Encounter: Payer: Self-pay | Admitting: Podiatry

## 2022-08-30 ENCOUNTER — Ambulatory Visit: Payer: Medicaid Other | Admitting: Podiatry

## 2022-08-30 DIAGNOSIS — L603 Nail dystrophy: Secondary | ICD-10-CM

## 2022-08-30 DIAGNOSIS — M79675 Pain in left toe(s): Secondary | ICD-10-CM | POA: Diagnosis not present

## 2022-08-30 DIAGNOSIS — M79674 Pain in right toe(s): Secondary | ICD-10-CM

## 2022-08-30 DIAGNOSIS — B351 Tinea unguium: Secondary | ICD-10-CM

## 2022-08-30 DIAGNOSIS — M79676 Pain in unspecified toe(s): Secondary | ICD-10-CM

## 2022-08-30 NOTE — Progress Notes (Signed)
  Subjective:  Patient ID: Johnathan Shannon, male    DOB: [birth year],  MRN: [id number] HPI Chief Complaint  Patient presents with   Nail Problem    Hallux left - thick, dark, long nail x years, injured when he was a small boy, not sore   New Patient (Initial Visit)    47 y.o. male presents with the above complaint.   ROS: Denies fever chills nausea vomit muscle aches pains calf pain back pain chest pain shortness of breath.  Past Medical History:  Diagnosis Date   Allergy    Hypertension    No past surgical history on file.  Current Outpatient Medications:    albuterol (PROVENTIL) (2.5 MG/3ML) 0.083% nebulizer solution, Take 2.5 mg by nebulization every 6 (six) hours as needed for wheezing or shortness of breath., Disp: , Rfl:    amLODipine (NORVASC) 5 MG tablet, Take 5 mg by mouth daily., Disp: , Rfl:    etodolac (LODINE) 400 MG tablet, Take 1 tablet (400 mg total) by mouth 2 (two) times daily., Disp: 60 tablet, Rfl: 3   fexofenadine (ALLEGRA) 180 MG tablet, Take 180 mg by mouth daily., Disp: , Rfl:    losartan (COZAAR) 50 MG tablet, Take 1 tablet (50 mg total) by mouth daily., Disp: 90 tablet, Rfl: 1   losartan-hydrochlorothiazide (HYZAAR) 100-25 MG tablet, Take 1 tablet by mouth daily., Disp: 30 tablet, Rfl: 0   meloxicam (MOBIC) 15 MG tablet, Take 15 mg by mouth daily., Disp: , Rfl:   Allergies  Allergen Reactions   Bee Venom    Review of Systems Objective:  There were no vitals filed for this visit.  General: Well developed, nourished, in no acute distress, alert and oriented x3   Dermatological: Skin is warm, dry and supple bilateral. Nails x 10 are long thick yellow dystrophic clinically mycotic particularly the hallux left remaining integument appears unremarkable at this time. There are no open sores, no preulcerative lesions, no rash or signs of infection present.  Brawny edema to the bilateral legs secondary to lymphedema  Vascular: Dorsalis Pedis artery and  Posterior Tibial artery pedal pulses are 2/4 bilateral with immedate capillary fill time. Pedal hair growth present. No varicosities and no lower extremity edema present bilateral.  Lymphedema bilateral legs  Neruologic: Grossly intact via light touch bilateral. Vibratory intact via tuning fork bilateral. Protective threshold with Semmes Wienstein monofilament intact to all pedal sites bilateral. Patellar and Achilles deep tendon reflexes 2+ bilateral. No Babinski or clonus noted bilateral.   Musculoskeletal: No gross boney pedal deformities bilateral. No pain, crepitus, or limitation noted with foot and ankle range of motion bilateral. Muscular strength 5/5 in all groups tested bilateral.  Gait: Unassisted, Nonantalgic.    Radiographs:  None taken  Assessment & Plan:   Assessment: Nail dystrophy  Plan: Samples of the skin and nail were taken today for pathologic evaluation     Johnathan Shannon T. [place], [place]

## 2022-09-29 ENCOUNTER — Ambulatory Visit: Payer: Medicaid Other | Admitting: Podiatry

## 2022-11-05 ENCOUNTER — Ambulatory Visit: Payer: Medicaid Other | Admitting: Internal Medicine

## 2023-04-11 ENCOUNTER — Emergency Department
Admission: EM | Admit: 2023-04-11 | Discharge: 2023-04-11 | Discharge status: Against Medical Advice | Payer: Medicaid Other

## 2023-04-11 NOTE — ED Notes (Signed)
Pt to first nurse desk stating that he was going to leave and come back later to be seen. Pt encouraged to stay as the wait is not likely to get better as the night goes on. Pt states that he would still like to leave and will return if symptoms come back.

## 2023-04-11 NOTE — ED Notes (Signed)
No answer when called several times from lobby

## 2023-04-15 ENCOUNTER — Ambulatory Visit: Payer: Medicaid Other | Admitting: Internal Medicine

## 2023-04-29 ENCOUNTER — Ambulatory Visit: Payer: Medicaid Other | Admitting: Internal Medicine

## 2023-05-02 ENCOUNTER — Ambulatory Visit: Payer: Medicaid Other | Admitting: Internal Medicine

## 2023-07-05 ENCOUNTER — Encounter: Payer: Self-pay | Admitting: Internal Medicine

## 2023-07-05 ENCOUNTER — Ambulatory Visit: Admitting: Internal Medicine

## 2023-07-05 VITALS — BP 140/80 | HR 88 | Temp 98.1°F | Ht >= 80 in | Wt 346.2 lb

## 2023-07-05 DIAGNOSIS — E669 Obesity, unspecified: Secondary | ICD-10-CM

## 2023-07-05 DIAGNOSIS — I10 Essential (primary) hypertension: Secondary | ICD-10-CM | POA: Diagnosis not present

## 2023-07-05 DIAGNOSIS — I872 Venous insufficiency (chronic) (peripheral): Secondary | ICD-10-CM | POA: Diagnosis not present

## 2023-07-05 DIAGNOSIS — R600 Localized edema: Secondary | ICD-10-CM

## 2023-07-05 DIAGNOSIS — S8392XA Sprain of unspecified site of left knee, initial encounter: Secondary | ICD-10-CM

## 2023-07-05 DIAGNOSIS — M25562 Pain in left knee: Secondary | ICD-10-CM

## 2023-07-05 DIAGNOSIS — E7800 Pure hypercholesterolemia, unspecified: Secondary | ICD-10-CM | POA: Diagnosis not present

## 2023-07-05 MED ORDER — MELOXICAM 15 MG PO TABS: 15.0000 mg | ORAL_TABLET | Freq: Every day | ORAL | 0 refills | Status: AC

## 2023-07-05 MED ORDER — ALBUTEROL SULFATE HFA 108 (90 BASE) MCG/ACT IN AERS: 2.0000 | INHALATION_SPRAY | Freq: Four times a day (QID) | RESPIRATORY_TRACT | 2 refills | Status: AC | PRN

## 2023-07-05 NOTE — Progress Notes (Signed)
 Established Patient Office Visit  Subjective:  Patient ID: Johnathan Shannon, male    DOB: December 03, 1975  Age: 48 y.o. MRN: [id number]  Chief Complaint  Patient presents with   Acute Visit    Swelling in left leg    C/o several month h/o of painless left leg swelling. On further questioning right leg also swollen but less so. Both legs are slimmer in the am and become more swollen as the day progresses as he stands most of the day at work. Also c/o that he sprained his left knee a few months ago and now gives way occasionally.    No other concerns at this time.   Past Medical History:  Diagnosis Date   Allergy    Hypertension     No past surgical history on file.  Social History   Socioeconomic History   Marital status: Married    Spouse name: Not on file   Number of children: Not on file   Years of education: Not on file   Highest education level: Not on file  Occupational History   Not on file  Tobacco Use   Smoking status: Never   Smokeless tobacco: Never  Vaping Use   Vaping status: Never Used  Substance and Sexual Activity   Alcohol use: Yes    Alcohol/week: 0.0 standard drinks of alcohol    Comment: rarely   Drug use: No   Sexual activity: Not on file  Other Topics Concern   Not on file  Social History Narrative   Not on file   Social Drivers of Health   Financial Resource Strain: Not on file  Food Insecurity: Not on file  Transportation Needs: Not on file  Physical Activity: Not on file  Stress: Not on file  Social Connections: Not on file  Intimate Partner Violence: Not on file    Family History  Family history unknown: Yes    Allergies  Allergen Reactions   Bee Venom     Outpatient Medications Prior to Visit  Medication Sig   albuterol  (PROVENTIL ) (2.5 MG/3ML) 0.083% nebulizer solution Take 2.5 mg by nebulization every 6 (six) hours as needed for wheezing or shortness of breath.   amLODipine  (NORVASC ) 5 MG tablet Take 5 mg by mouth  daily.   fexofenadine (ALLEGRA) 180 MG tablet Take 180 mg by mouth daily.   losartan  (COZAAR ) 50 MG tablet Take 1 tablet (50 mg total) by mouth daily.   losartan -hydrochlorothiazide (HYZAAR) 100-25 MG tablet Take 1 tablet by mouth daily.   [DISCONTINUED] etodolac  (LODINE ) 400 MG tablet Take 1 tablet (400 mg total) by mouth 2 (two) times daily.   [DISCONTINUED] meloxicam  (MOBIC ) 15 MG tablet Take 15 mg by mouth daily.   No facility-administered medications prior to visit.    Review of Systems  Constitutional:  Positive for weight loss (60 lbs with dieting).  All other systems reviewed and are negative.      Objective:   BP (!) 140/80   Pulse 88   Temp 98.1 F (36.7 C)   Ht 6\' 8"  (2.032 m)   Wt (!) 346 lb 3.2 oz (157 kg)   SpO2 95%   BMI 38.03 kg/m   Vitals:   07/05/23 1356  BP: (!) 140/80  Pulse: 88  Temp: 98.1 F (36.7 C)  Height: 6\' 8"  (2.032 m)  Weight: (!) 346 lb 3.2 oz (157 kg)  SpO2: 95%  BMI (Calculated): 38.03    Physical Exam Vitals reviewed.  Constitutional:  Appearance: Normal appearance. He is obese.  HENT:     Head: Normocephalic.     Left Ear: There is no impacted cerumen.     Nose: Nose normal.     Mouth/Throat:     Mouth: Mucous membranes are moist.     Pharynx: No posterior oropharyngeal erythema.  Eyes:     Extraocular Movements: Extraocular movements intact.     Pupils: Pupils are equal, round, and reactive to light.  Cardiovascular:     Rate and Rhythm: Regular rhythm.     Chest Wall: PMI is not displaced.     Pulses: Normal pulses.     Heart sounds: Normal heart sounds. No murmur heard. Pulmonary:     Effort: Pulmonary effort is normal.     Breath sounds: Normal air entry. No rhonchi or rales.  Abdominal:     General: Abdomen is flat. Bowel sounds are normal. There is no distension.     Palpations: Abdomen is soft. There is no hepatomegaly, splenomegaly or mass.     Tenderness: There is no abdominal tenderness.   Musculoskeletal:        General: Normal range of motion.     Cervical back: Normal range of motion and neck supple.     Left knee: Swelling and effusion present. No erythema. No LCL laxity, MCL laxity, ACL laxity or PCL laxity.    Right lower leg: Edema present.     Left lower leg: Edema present.     Comments: Bilat brawny edema  Skin:    General: Skin is warm and dry.  Neurological:     General: No focal deficit present.     Mental Status: He is alert and oriented to person, place, and time.     Cranial Nerves: No cranial nerve deficit.     Motor: No weakness.  Psychiatric:        Mood and Affect: Mood normal.        Behavior: Behavior normal.      No results found for any visits on 07/05/23.  No results found for this or any previous visit (from the past 2160 hours).    Assessment & Plan:  As per problem list  Problem List Items Addressed This Visit       Cardiovascular and Mediastinum   Essential hypertension     Other   Pure hypercholesterolemia   Other Visit Diagnoses       Leg edema, left    -  Primary   Relevant Orders   US  Venous Img Lower Unilateral Left     Sprain of left knee, unspecified ligament, initial encounter       Relevant Medications   meloxicam  (MOBIC ) 15 MG tablet   Other Relevant Orders   DG Knee Complete 4 Views Left   Ambulatory referral to Orthopedics     Venous insufficiency       Relevant Orders   Compression stockings     Obesity (BMI 30-39.9)           Return in about 3 weeks (around 07/26/2023) for Imaging results.   Total time spent: 20 minutes  Arzella Bitters, MD  07/05/2023   This document may have been prepared by [facility] Voice Recognition software and as such may include unintentional dictation errors.

## 2023-10-25 ENCOUNTER — Ambulatory Visit: Admitting: Internal Medicine

## 2023-10-26 IMAGING — CR DG LUMBAR SPINE COMPLETE 4+V
5 series · 5 of 5 positions shown · non-contrast
Comparison: 09/21/2019

CLINICAL DATA: Chronic back pain

EXAM:
LUMBAR SPINE - COMPLETE 4+ VIEW

[l-spine ap]
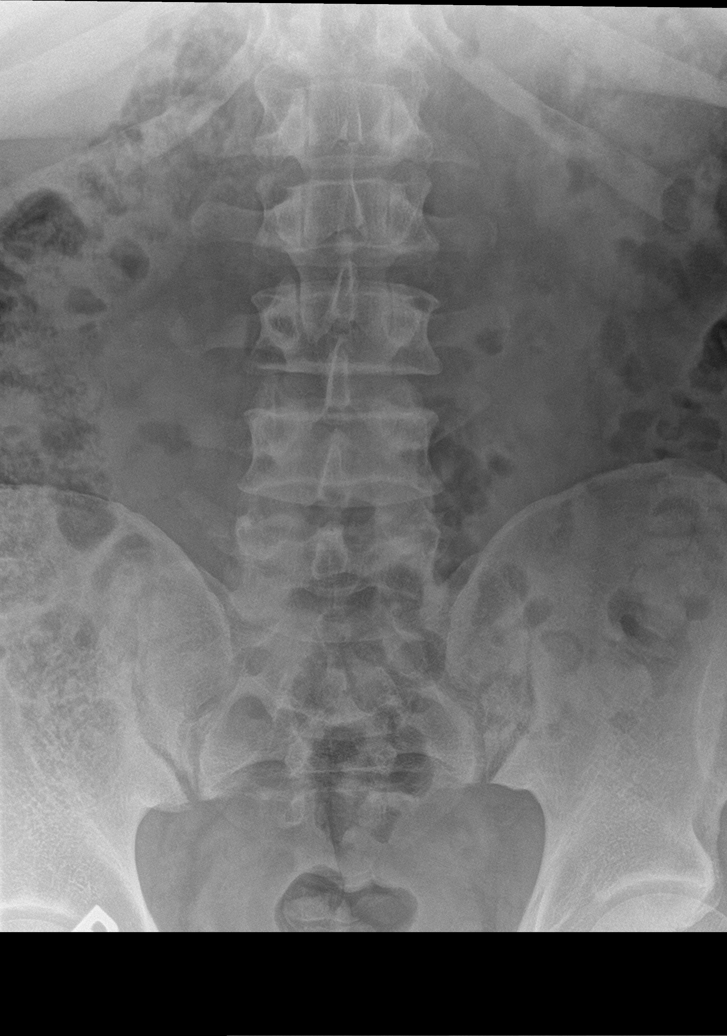

[l-spine obl (1 of 2)]
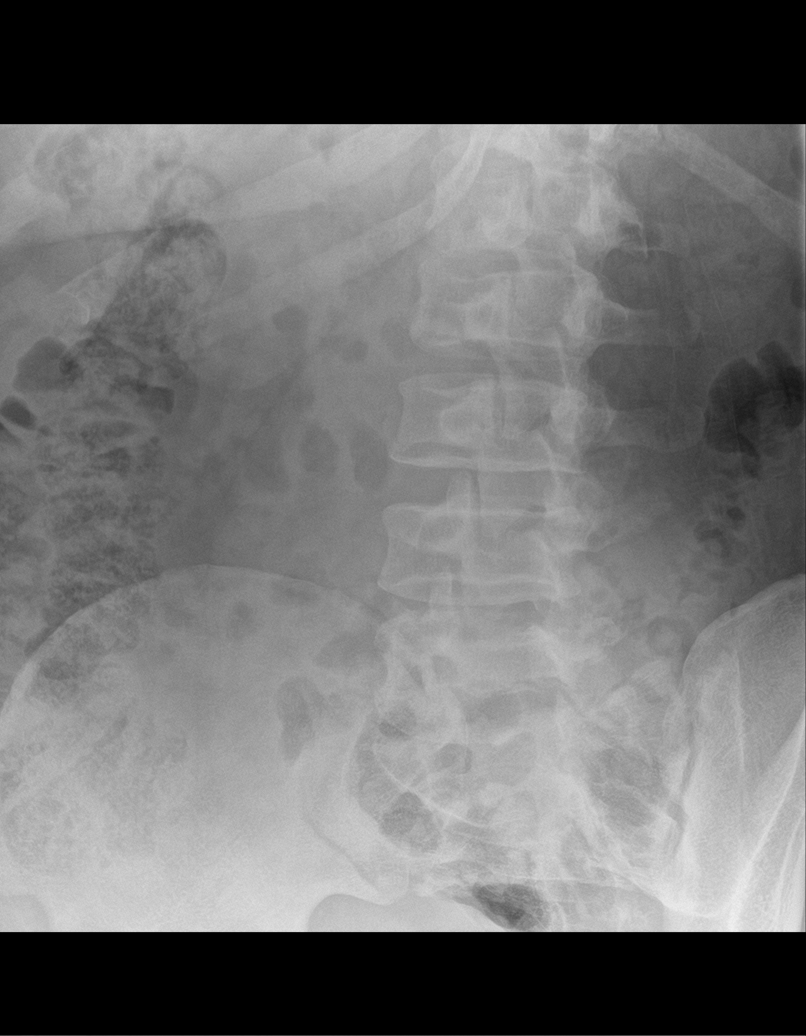

[l-spine obl (2 of 2)]
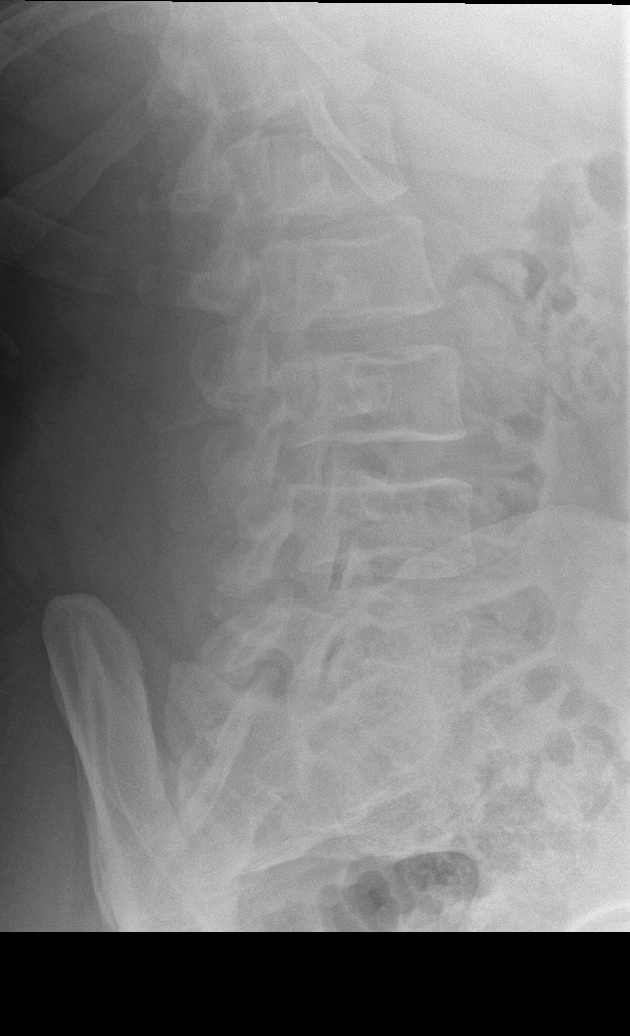

[l-spine lat]
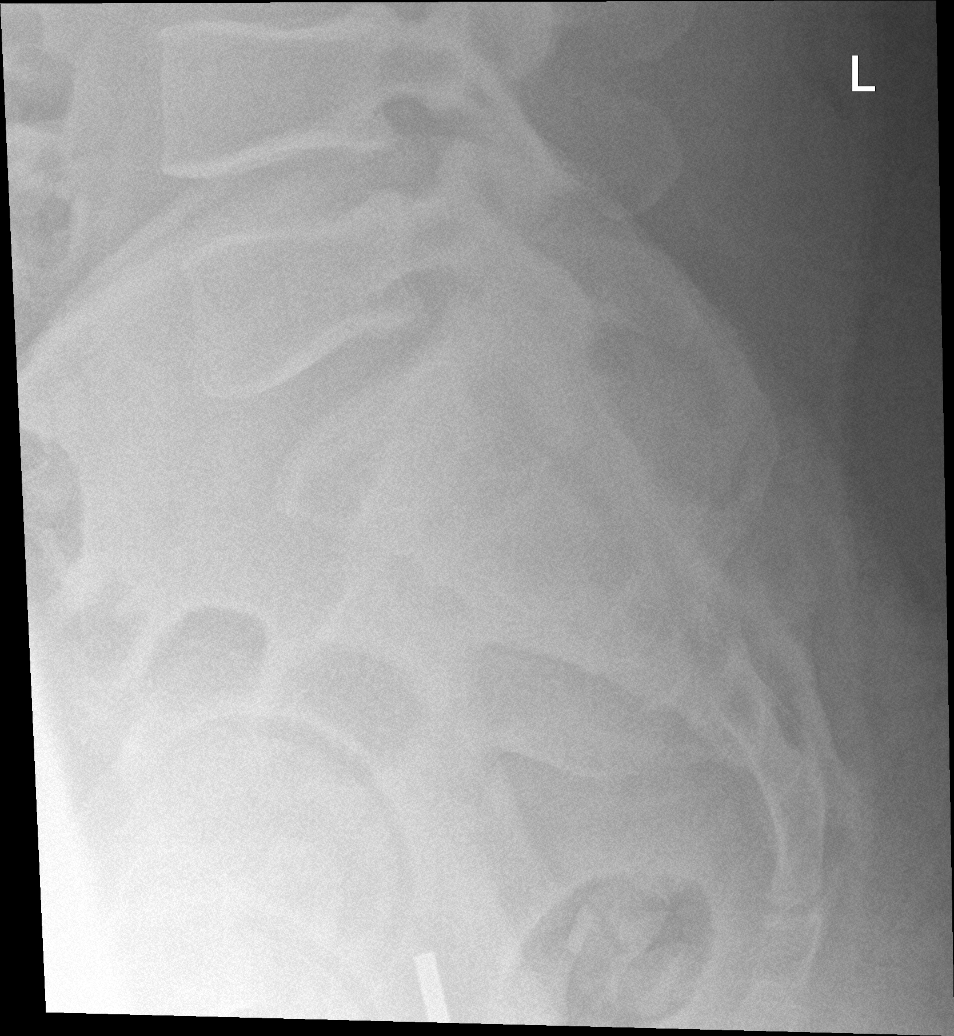

[l-spine spot]
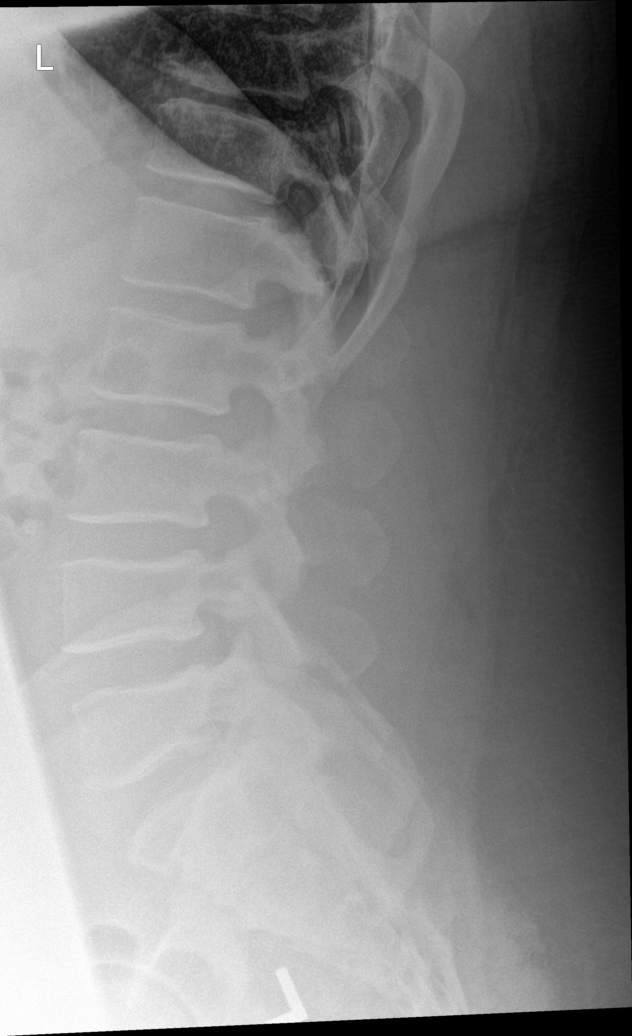

[5 of 5 positions shown; findings below may reference images not displayed]

FINDINGS: There is no evidence of lumbar spine fracture. Alignment is normal.
Intervertebral disc spaces are maintained.
IMPRESSION: Negative.

## 2023-10-28 ENCOUNTER — Ambulatory Visit: Admitting: Internal Medicine

## 2023-11-22 ENCOUNTER — Ambulatory Visit: Admitting: Internal Medicine

## 2023-11-30 ENCOUNTER — Ambulatory Visit: Admitting: Internal Medicine

## 2023-12-12 ENCOUNTER — Ambulatory Visit: Admitting: Internal Medicine

## 2024-01-18 ENCOUNTER — Telehealth: Admitting: Physician Assistant

## 2024-01-18 DIAGNOSIS — N529 Male erectile dysfunction, unspecified: Secondary | ICD-10-CM

## 2024-01-18 DIAGNOSIS — R600 Localized edema: Secondary | ICD-10-CM

## 2024-01-18 DIAGNOSIS — M5431 Sciatica, right side: Secondary | ICD-10-CM

## 2024-01-18 MED ORDER — MELOXICAM 15 MG PO TABS: 15.0000 mg | ORAL_TABLET | Freq: Every day | ORAL | 0 refills | Status: AC

## 2024-01-18 NOTE — Patient Instructions (Signed)
 Johnathan Shannon, thank you for joining Elsie Velma Lunger, PA-C for today's virtual visit.  While this provider is not your primary care provider (PCP), if your PCP is located in our provider database this encounter information will be shared with them immediately following your visit.   A [facility] MyChart account gives you access to today's visit and all your visits, tests, and labs performed at [facility]  click here if you don't have a [facility] MyChart account or go to [email or web address]  Consent: (Patient) Johnathan Shannon provided verbal consent for this virtual visit at the beginning of the encounter.  Current Medications:  Current Outpatient Medications:    meloxicam  (MOBIC ) 15 MG tablet, Take 1 tablet (15 mg total) by mouth daily., Disp: 30 tablet, Rfl: 0   albuterol  (PROVENTIL ) (2.5 MG/3ML) 0.083% nebulizer solution, Take 2.5 mg by nebulization every 6 (six) hours as needed for wheezing or shortness of breath., Disp: , Rfl:    albuterol  (VENTOLIN  HFA) 108 (90 Base) MCG/ACT inhaler, Inhale 2 puffs into the lungs every 6 (six) hours as needed for wheezing or shortness of breath., Disp: 8 g, Rfl: 2   amLODipine  (NORVASC ) 5 MG tablet, Take 5 mg by mouth daily., Disp: , Rfl:    fexofenadine (ALLEGRA) 180 MG tablet, Take 180 mg by mouth daily., Disp: , Rfl:    losartan  (COZAAR ) 50 MG tablet, Take 1 tablet (50 mg total) by mouth daily., Disp: 90 tablet, Rfl: 1   losartan -hydrochlorothiazide (HYZAAR) 100-25 MG tablet, Take 1 tablet by mouth daily., Disp: 30 tablet, Rfl: 0   Medications ordered in this encounter:  Meds ordered this encounter  Medications   meloxicam  (MOBIC ) 15 MG tablet    Sig: Take 1 tablet (15 mg total) by mouth daily.    Dispense:  30 tablet    Refill:  0    Supervising Provider:   LAMPTEY, PHILIP O [[id number]]     *If you need refills on other medications prior to your next appointment, please contact your pharmacy*  Follow-Up: Call back  or seek an in-person evaluation if the symptoms worsen or if the condition fails to improve as anticipated.  [facility] [phone]  Other Instructions Please continue to hydrate, but remember to not drink excessive amounts of fluids daily. Be very cautious of the sodium/salt content of the food you are eating. Elevate the leg while resting. Consider getting an Ace wrap as discussed to add compression to the leg during episodes of swelling. You could consider a trial of a dandelion tea as this contains a natural diuretic, but if no substantial improvement in swelling/any excessive urination, stop it and be evaluated in person as discussed.  For the back, avoid heavy lifting and overexertion.  Take the meloxicam  as directed. Start the stretches below. If you note any non-resolving, new, or worsening symptoms despite treatment, please seek an in-person evaluation ASAP.   Sciatica Rehab Ask your health care provider which exercises are safe for you. Do exercises exactly as told by your health care provider and adjust them as directed. It is normal to feel mild stretching, pulling, tightness, or discomfort as you do these exercises. Stop right away if you feel sudden pain or your pain gets worse. Do not begin these exercises until told by your health care provider. Stretching and range-of-motion exercises These exercises warm up your muscles and joints and improve the movement and flexibility of your hips and back. These exercises also help to relieve  pain, numbness, and tingling. Sciatic nerve glide  Sit in a chair with your head facing down toward your chest. Place your hands behind your back. Let your shoulders slump forward. Slowly straighten one of your legs while you tilt your head back as if you are looking toward the ceiling. Only straighten your leg as far as you can without making your symptoms worse. Hold this position for __________ seconds. Slowly return your leg  and head back to the starting position. Repeat with your other leg. Repeat __________ times. Complete this exercise __________ times a day. Knee to chest with hip adduction and internal rotation  Lie on your back on a firm surface with both legs straight. Bend one of your knees and move it up toward your chest until you feel a gentle stretch in your lower back and buttock. Then, move your knee toward the shoulder that is on the opposite side from your leg. This is hip adduction and internal rotation. Hold your leg in this position by holding on to the front of your knee. Hold this position for __________ seconds. Slowly return to the starting position. Repeat with your other leg. Repeat __________ times. Complete this exercise __________ times a day. Prone extension on elbows  Lie on your abdomen on a firm surface. A bed may be too soft for this exercise. Prop yourself up on your elbows. Use your arms to help lift your chest up until you feel a gentle stretch in your abdomen and your lower back. This will place some of your body weight on your elbows. If this is uncomfortable, try stacking pillows under your chest. Your hips should stay down, against the surface that you are lying on. Keep your hip and back muscles relaxed. Hold this position for __________ seconds. Slowly relax your upper body and return to the starting position. Repeat __________ times. Complete this exercise __________ times a day. Strengthening exercises These exercises build strength and endurance in your back. Endurance is the ability to use your muscles for a long time, even after they get tired. Pelvic tilt This exercise strengthens the muscles that lie deep in the abdomen. Lie on your back on a firm surface. Bend your knees and keep your feet flat on the surface. Tense your abdominal muscles. Tip your pelvis up toward the ceiling and flatten your lower back into the firm surface. To help with this exercise, you  may place a small towel under your lower back and try to push your back into the towel. Hold this position for __________ seconds. Let your muscles relax completely before you repeat this exercise. Repeat __________ times. Complete this exercise __________ times a day. Alternating arm and leg raises  Get on your hands and knees on a firm surface. If you are on a hard floor, you may want to use padding, such as an exercise mat, to cushion your knees. Line up your arms and legs. Your hands should be directly below your shoulders, and your knees should be directly below your hips. Lift your left leg behind you. At the same time, raise your right arm and straighten it in front of you. Do not lift your leg higher than your hip. Do not lift your arm higher than your shoulder. Keep your abdominal and back muscles tight. Keep your hips facing the ground. Do not arch your back. Keep your balance carefully, and do not hold your breath. Hold this position for __________ seconds. Slowly return to the starting position. Repeat with  your right leg and your left arm. Repeat __________ times. Complete this exercise __________ times a day. Posture and body mechanics Good posture and healthy body mechanics can help to relieve stress in your body's tissues and joints. Body mechanics refers to the movements and positions of your body while you do your daily activities. Posture is part of body mechanics. Good posture means: Your spine is in its natural S-curve position (neutral). Your shoulders are pulled back slightly. Your head is not tipped forward. Follow these guidelines to improve your posture and body mechanics in your everyday activities. Standing  When standing, keep your spine neutral and your feet about hip width apart. Keep a slight bend in your knees. Your ears, shoulders, and hips should line up. When you do a task in which you stand in one place for a long time, place one foot up on a stable  object that is 2-4 inches (5-10 cm) high, such as a footstool. This helps keep your spine neutral. Sitting  When sitting, keep your spine neutral and keep your feet flat on the floor. Use a footrest, if necessary, and keep your thighs parallel to the floor. Avoid rounding your shoulders, and avoid tilting your head forward. When working at a desk or a computer, keep your desk at a height where your hands are slightly lower than your elbows. Slide your chair under your desk so you are close enough to maintain good posture. When working at a computer, place your monitor at a height where you are looking straight ahead and you do not have to tilt your head forward or downward to look at the screen. Resting  When lying down and resting, avoid positions that are most painful for you. If you have pain with activities such as sitting, bending, stooping, or squatting, lie in a position in which your body does not bend very much. For example, avoid curling up on your side with your arms and knees near your chest (fetal position). If you have pain with activities such as standing for a long time or reaching with your arms, lie with your spine in a neutral position and bend your knees slightly. Try the following positions: Lying on your side with a pillow between your knees. Lying on your back with a pillow under your knees. Lifting  When lifting objects, keep your feet at least shoulder width apart and tighten your abdominal muscles. Bend your knees and hips and keep your spine neutral. It is important to lift using the strength of your legs, not your back. Do not lock your knees straight out. Always ask for help to lift heavy or awkward objects. This information is not intended to replace advice given to you by your health care provider. Make sure you discuss any questions you have with your health care provider. Document Revised: 06/09/2021 Document Reviewed: 06/09/2021 Elsevier Patient Education  2024  [employer]   If you have been instructed to have an in-person evaluation today at a local Urgent Care facility, please use the link below. It will take you to a list of all of our available [facility], including address, phone number and hours of operation. Please do not delay care.  [facility]  If you or a family member do not have a primary care provider, use the link below to schedule a visit and establish care. When you choose a [facility] primary care physician or advanced practice provider, you gain a long-term partner in  health. Find a Primary Care Provider  Learn more about [facility] in-office and virtual care options: [facility] - Get Care Now

## 2024-01-18 NOTE — Progress Notes (Signed)
 Virtual Visit Consent   Johnathan Shannon, you are scheduled for a virtual visit with a [facility] provider today. Just as with appointments in the office, your consent must be obtained to participate. Your consent will be active for this visit and any virtual visit you may have with one of our providers in the next 365 days. If you have a MyChart account, a copy of this consent can be sent to you electronically.  As this is a virtual visit, video technology does not allow for your provider to perform a traditional examination. This may limit your provider's ability to fully assess your condition. If your provider identifies any concerns that need to be evaluated in person or the need to arrange testing (such as labs, EKG, etc.), we will make arrangements to do so. Although advances in technology are sophisticated, we cannot ensure that it will always work on either your end or our end. If the connection with a video visit is poor, the visit may have to be switched to a telephone visit. With either a video or telephone visit, we are not always able to ensure that we have a secure connection.  By engaging in this virtual visit, you consent to the provision of healthcare and authorize for your insurance to be billed (if applicable) for the services provided during this visit. Depending on your insurance coverage, you may receive a charge related to this service.  I need to obtain your verbal consent now. Are you willing to proceed with your visit today? Johnathan Shannon has provided verbal consent on 01/18/2024 for a virtual visit (video or telephone). Johnathan Shannon, [place]  Date: 01/18/2024 4:07 PM   Virtual Visit via Video Note   I, Johnathan Shannon, connected with  Johnathan Shannon  ([id number], 10-24-1975) on 01/18/24 at  3:45 PM EST by a video-enabled telemedicine application and verified that I am speaking with the correct person using two identifiers.  Location: Patient: Virtual Visit  Location Patient: Home Provider: Virtual Visit Location Provider: Home Office   I discussed the limitations of evaluation and management by telemedicine and the availability of in person appointments. The patient expressed understanding and agreed to proceed.    History of Present Illness: Johnathan Shannon is a 48 y.o. who identifies as a male who was assigned male at birth, and is being seen today for multiple complaints.  Patient endorses 4 days of right sided lower back and gluteal pain with radiation down the leg.  Some sensation of tightness in the leg without actual swelling.  Denies numbness or tingling.  Denies trauma or injury to the area.  Has not taken anything for the symptoms.  Patient also endorses some intermittent episodes of left lower leg swelling.  Notes it [place] comes and goes.  He is trying to stay well-hydrated and limiting salt intake.  Was previously on a diuretic, mainly for his blood pressure, but has not taken in several years.  Notes when he checks his blood pressure at home it is normal.  Denies any redness or bruising of skin.  Some slight hyperpigmentation of lower leg.  Denies history of ulcer.  Does have history of venous insufficiency listed in his chart.  Denies any substantial swelling of the right lower extremity.  No recent procedure or history of clotting disorder.  Patient also would like to know who to speak with regarding some issues he is having with erectile dysfunction.  Notes more recently having an issue achieving  an erection sufficient for penetration.  Is currently without a PCP.  Is requesting a note for work for tomorrow due to his back pain.   HPI: HPI  Problems:  Patient Active Problem List   Diagnosis Date Noted   Morbid obesity with BMI of 40.0-44.9, adult (HCC) 08/02/2022   Pure hypercholesterolemia 08/02/2022   Onychomycosis 08/02/2022   Diastolic dysfunction 11/05/2019   Chronic bilateral low back pain with bilateral sciatica  09/26/2019   Essential hypertension 09/26/2019   Dyspnea on exertion 09/26/2019    Allergies:  Allergies  Allergen Reactions   Bee Venom    Medications:  Current Outpatient Medications:    meloxicam  (MOBIC ) 15 MG tablet, Take 1 tablet (15 mg total) by mouth daily., Disp: 30 tablet, Rfl: 0   albuterol  (PROVENTIL ) (2.5 MG/3ML) 0.083% nebulizer solution, Take 2.5 mg by nebulization every 6 (six) hours as needed for wheezing or shortness of breath., Disp: , Rfl:    albuterol  (VENTOLIN  HFA) 108 (90 Base) MCG/ACT inhaler, Inhale 2 puffs into the lungs every 6 (six) hours as needed for wheezing or shortness of breath., Disp: 8 g, Rfl: 2   amLODipine  (NORVASC ) 5 MG tablet, Take 5 mg by mouth daily., Disp: , Rfl:    fexofenadine (ALLEGRA) 180 MG tablet, Take 180 mg by mouth daily., Disp: , Rfl:    losartan  (COZAAR ) 50 MG tablet, Take 1 tablet (50 mg total) by mouth daily., Disp: 90 tablet, Rfl: 1   losartan -hydrochlorothiazide (HYZAAR) 100-25 MG tablet, Take 1 tablet by mouth daily., Disp: 30 tablet, Rfl: 0  Observations/Objective: Patient is well-developed, well-nourished in no acute distress.  Resting comfortably at home.  Head is normocephalic, atraumatic.  No labored breathing.  Speech is clear and coherent with logical content.  Patient is alert and oriented at baseline.  Due to issues with video.  Unable to physically examine the patient's lower extremities.  Assessment and Plan: 1. Sciatica of right side (Primary) - meloxicam  (MOBIC ) 15 MG tablet; Take 1 tablet (15 mg total) by mouth daily.  Dispense: 30 tablet; Refill: 0  Atraumatic.  Per chart he does have a history of intermittent episodes of low back pain and sciatica.  Patient wants to avoid any more medication than necessary.  Supportive measures and OTC medications reviewed.  Stretching exercises reviewed for him to start at home.  Will have him take a short course of meloxicam  once daily.  Can use Tylenol OTC.  Needs follow-up  in person for any nonresolving, new or worsening symptoms despite treatment given today.  2. Peripheral edema  M patient with history of hypertension, not currently on any medications.  Patient endorses that blood pressure is under good control.  Hydrates and limit salt intake.  Does have documented history of venous insufficiency of the left leg.  Suspect this is the most likely culprit.  Will have him continue to limit sodium intake.  It also seems he is drinking an excessive amount of water per day so encouraged him to drink decrease this by a couple of glasses.  Elevate leg while resting.  Compression stockings discussed.  Discussed as needed use of a loop diuretic.  Patient wants to avoid any more medication.  He is to monitor symptoms.  If anything not improving or worsening, he is to seek an in person evaluation.  Link given to help him establish with a new primary care provider as well.  3. Erectile dysfunction, unspecified erectile dysfunction type  Patient mention at the end of the  visit.  Unclear etiology.  Suspect multifactorial.  Needs a further assessment to ensure no secondary causes that need treatment.  Resources given to help him establish with a new provider to take care of this issue.  Follow Up Instructions: I discussed the assessment and treatment plan with the patient. The patient was provided an opportunity to ask questions and all were answered. The patient agreed with the plan and demonstrated an understanding of the instructions.  A copy of instructions were sent to the patient via MyChart unless otherwise noted below.   The patient was advised to call back or seek an in-person evaluation if the symptoms worsen or if the condition fails to improve as anticipated.    Johnathan Velma Lunger, PA-C

## 2024-01-19 ENCOUNTER — Telehealth: Admitting: Physician Assistant

## 2024-01-19 DIAGNOSIS — M5441 Lumbago with sciatica, right side: Secondary | ICD-10-CM

## 2024-01-19 NOTE — Progress Notes (Signed)
 Patient seen yesterday and had requested a work note, but was not given. Following up for work note only.  Work note provided.   No Charge.

## 2024-03-17 ENCOUNTER — Emergency Department
Admission: EM | Admit: 2024-03-17 | Discharge: 2024-03-18 | Disposition: A | Attending: Emergency Medicine | Admitting: Emergency Medicine

## 2024-03-17 ENCOUNTER — Emergency Department

## 2024-03-17 DIAGNOSIS — R030 Elevated blood-pressure reading, without diagnosis of hypertension: Secondary | ICD-10-CM

## 2024-03-17 DIAGNOSIS — S8392XA Sprain of unspecified site of left knee, initial encounter: Secondary | ICD-10-CM

## 2024-03-17 DIAGNOSIS — M25552 Pain in left hip: Secondary | ICD-10-CM | POA: Insufficient documentation

## 2024-03-17 DIAGNOSIS — M79605 Pain in left leg: Secondary | ICD-10-CM | POA: Diagnosis not present

## 2024-03-17 DIAGNOSIS — W010XXA Fall on same level from slipping, tripping and stumbling without subsequent striking against object, initial encounter: Secondary | ICD-10-CM | POA: Insufficient documentation

## 2024-03-17 DIAGNOSIS — M79604 Pain in right leg: Secondary | ICD-10-CM | POA: Insufficient documentation

## 2024-03-17 DIAGNOSIS — W19XXXA Unspecified fall, initial encounter: Secondary | ICD-10-CM

## 2024-03-17 DIAGNOSIS — M25551 Pain in right hip: Secondary | ICD-10-CM | POA: Diagnosis not present

## 2024-03-17 DIAGNOSIS — R600 Localized edema: Secondary | ICD-10-CM | POA: Diagnosis not present

## 2024-03-17 LAB — CBC WITH DIFFERENTIAL/PLATELET
Abs Immature Granulocytes: 0.01 K/uL (ref 0.00–0.07)
Basophils Absolute: 0 K/uL (ref 0.0–0.1)
Basophils Relative: 1 %
Eosinophils Absolute: 0.2 K/uL (ref 0.0–0.5)
Eosinophils Relative: 4 %
HCT: 46.3 % (ref 39.0–52.0)
Hemoglobin: 15.3 g/dL (ref 13.0–17.0)
Immature Granulocytes: 0 %
Lymphocytes Relative: 19 %
Lymphs Abs: 1.1 K/uL (ref 0.7–4.0)
MCH: 28.4 pg (ref 26.0–34.0)
MCHC: 33 g/dL (ref 30.0–36.0)
MCV: 86.1 fL (ref 80.0–100.0)
Monocytes Absolute: 0.6 K/uL (ref 0.1–1.0)
Monocytes Relative: 11 %
Neutro Abs: 3.8 K/uL (ref 1.7–7.7)
Neutrophils Relative %: 65 %
Platelets: 171 K/uL (ref 150–400)
RBC: 5.38 MIL/uL (ref 4.22–5.81)
RDW: 13.3 % (ref 11.5–15.5)
WBC: 5.7 K/uL (ref 4.0–10.5)
nRBC: 0 % (ref 0.0–0.2)

## 2024-03-17 MED ORDER — ACETAMINOPHEN 500 MG PO TABS
1000.0000 mg | ORAL_TABLET | Freq: Once | ORAL | Status: AC
  Administered 2024-03-17: 1000 mg via ORAL
  Filled 2024-03-17: qty 2

## 2024-03-17 NOTE — ED Provider Notes (Incomplete)
 "  [facility] Provider Note    Event Date/Time   First MD Initiated Contact with Patient 03/17/24 2301     (approximate)   History   Fall   HPI  Johnathan Shannon is a 49 y.o. male who denies being to a doctor regularly who comes in with concerns for a fall.  Patient reports having mechanical fall when she fell onto his legs when he tripped over some branches in [place].  He denies any in his head denies any neck pain.  He does report some swelling to his lower legs bilaterally.  He also reports generalized pain from the hip down to the tib-fib on the left side and on the hip down to the knee on the right side.  I reviewed a note from 07/05/2023 where patient had left leg edema and a ultrasound was ordered but patient does not appear that he ever had this done.  He denies any chest pain, shortness of breath or any other concerns.  We discussed his elevated blood pressure he denies any known history of this.  He is not taking anything to try to help with the pain.  He has been ambulatory since the fall  Physical Exam   Triage Vital Signs: ED Triage Vitals  Encounter Vitals Group     BP 03/17/24 2213 (!) 180/105     Girls Systolic BP Percentile --      Girls Diastolic BP Percentile --      Boys Systolic BP Percentile --      Boys Diastolic BP Percentile --      Pulse Rate 03/17/24 2213 83     Resp 03/17/24 2213 18     Temp 03/17/24 2213 98.9 F (37.2 C)     Temp Source 03/17/24 2213 Oral     SpO2 03/17/24 2213 97 %     Weight 03/17/24 2211 (!) 376 lb 12.3 oz (170.9 kg)     Height 03/17/24 2211 6' 8 (2.032 m)     Head Circumference --      Peak Flow --      Pain Score 03/17/24 2211 10     Pain Loc --      Pain Education --      Exclude from Growth Chart --     Most recent vital signs: Vitals:   03/17/24 2213 03/17/24 2214  BP: (!) 180/105 (!) 206/109  Pulse: 83   Resp: 18   Temp: 98.9 F (37.2 C)   SpO2: 97%      General: Awake, no  distress.  CV:  Good peripheral perfusion.  Resp:  Normal effort.  Abd:  No distention.  Soft and nontender Other:  Some edema noted in bilateral legs little bit worse on the left compared to the right.  2+ distal pulses.  Able to lift both legs up off the bed but he reports some generalized pain from the hip to the tib-fib on the left and hip to the knee on the right. No pain in ankle or foot bilaterally.   No extremity pain in the arms.  No evidence of trauma to the head.  No CTL spine tenderness.   ED Results / Procedures / Treatments   Labs (all labs ordered are listed, but only abnormal results are displayed) Labs Reviewed  CBC WITH DIFFERENTIAL/PLATELET  COMPREHENSIVE METABOLIC PANEL WITH GFR  PRO BRAIN NATRIURETIC PEPTIDE  URINALYSIS, ROUTINE W REFLEX MICROSCOPIC      RADIOLOGY I have  reviewed the xray personally and interpreted no evidence of any fractures   PROCEDURES:  Critical Care performed: No  Procedures   MEDICATIONS ORDERED IN ED: Medications  ketorolac  (TORADOL ) 15 MG/ML injection 30 mg (has no administration in time range)  acetaminophen  (TYLENOL ) tablet 1,000 mg (1,000 mg Oral Given 03/17/24 2333)     IMPRESSION / MDM / ASSESSMENT AND PLAN / ED COURSE  I reviewed the triage vital signs and the nursing notes.   Patient's presentation is most consistent with acute presentation with potential threat to life or bodily function.   Patient comes in hypertensive with swelling in the legs and pain in the legs after a mechanical fall.  Given patient's hypertension and swelling in legs will get blood work to evaluate for CHF, kidney failure, liver failure, nephrotic syndrome.  Will get x-rays evaluate for any fractures and ultrasound to evaluate for DVT.  Good distal pulses unlikely arterial issue.  Ultrasounds were negative.  X-rays were negative  BNP is normal.  CBC reassuring CMP reassuring .   Patient's elevated blood pressure was discussed and given the  mild edema in the legs I do not want to start any amlodipine  as this can worsen it.  We discussed doing a low-dose hydrochlorothiazide  to start.  We discussed needing lab work outpatient after 1 to 2 weeks to ensure not affecting his electrolytes.  He does report having a primary care doctor he saw in the last year that he can follow-up with.  On review of records it does appear that patient was previously on hypertensive medications but he denies having taken them in years as he went all-natural.  We discussed a low-dose hydrochlorothiazide  to start with and to follow-up for recheck of blood pressure.     FINAL CLINICAL IMPRESSION(S) / ED DIAGNOSES   Final diagnoses:  Fall, initial encounter  Elevated blood pressure reading     Rx / DC Orders   ED Discharge Orders          Ordered    hydrochlorothiazide  (MICROZIDE ) 12.5 MG capsule  Daily        03/18/24 0118             Note:  This document was prepared using Dragon voice recognition software and may include unintentional dictation errors.   Ernest Ronal BRAVO, MD 03/18/24 9880    Ernest Ronal BRAVO, MD 03/18/24 0121  "

## 2024-03-17 NOTE — ED Triage Notes (Signed)
 Pt. In via POV from home for fall that occurred Wednesday while walking in the woods, staes he fell and rolled down a ravine, c/o pain in the left hip that radiates down to knee and calf, states his lower leg is swollen

## 2024-03-18 LAB — URINALYSIS, ROUTINE W REFLEX MICROSCOPIC
Bacteria, UA: NONE SEEN
Bilirubin Urine: NEGATIVE
Glucose, UA: NEGATIVE mg/dL
Ketones, ur: NEGATIVE mg/dL
Leukocytes,Ua: NEGATIVE
Nitrite: NEGATIVE
Protein, ur: NEGATIVE mg/dL
Specific Gravity, Urine: 1.028 (ref 1.005–1.030)
pH: 5 (ref 5.0–8.0)

## 2024-03-18 LAB — COMPREHENSIVE METABOLIC PANEL WITH GFR
ALT: 27 U/L (ref 0–44)
AST: 21 U/L (ref 15–41)
Albumin: 4.3 g/dL (ref 3.5–5.0)
Alkaline Phosphatase: 91 U/L (ref 38–126)
Anion gap: 11 (ref 5–15)
BUN: 15 mg/dL (ref 6–20)
CO2: 25 mmol/L (ref 22–32)
Calcium: 9.2 mg/dL (ref 8.9–10.3)
Chloride: 107 mmol/L (ref 98–111)
Creatinine, Ser: 0.84 mg/dL (ref 0.61–1.24)
GFR, Estimated: >60 mL/min
Glucose, Bld: 112 mg/dL — ABNORMAL HIGH (ref 70–99)
Potassium: 3.6 mmol/L (ref 3.5–5.1)
Sodium: 142 mmol/L (ref 135–145)
Total Bilirubin: 0.5 mg/dL (ref 0.0–1.2)
Total Protein: 6.6 g/dL (ref 6.5–8.1)

## 2024-03-18 LAB — PRO BRAIN NATRIURETIC PEPTIDE: Pro Brain Natriuretic Peptide: 102 pg/mL

## 2024-03-18 LAB — CK: Total CK: 312 U/L (ref 49–397)

## 2024-03-18 MED ORDER — HYDROCHLOROTHIAZIDE 12.5 MG PO CAPS: 12.5000 mg | ORAL_CAPSULE | Freq: Every day | ORAL | 0 refills | Status: AC

## 2024-03-18 MED ORDER — KETOROLAC TROMETHAMINE 15 MG/ML IJ SOLN
30.0000 mg | Freq: Once | INTRAMUSCULAR | Status: AC
  Administered 2024-03-18: 30 mg via INTRAMUSCULAR
  Filled 2024-03-18: qty 2

## 2024-03-18 NOTE — Discharge Instructions (Addendum)
 I have started you on a low-dose medication to help with your blood pressure and it can also help with a little bit of the swelling.  You should follow-up with your primary care doctor for recheck of your electrolytes to ensure that you are tolerating this okay you will most likely need additional blood pressure medication added.  Your x-rays and ultrasounds were negative.  You should take Tylenol  1 g every 8 hours and ibuprofen 600 every 8 hours with food for up to 1 week to try to help with any pain.  To the ER if you develop chest pain, shortness of breath, any other concerns

## 2024-04-02 ENCOUNTER — Ambulatory Visit: Admitting: Cardiology

## 2024-04-05 ENCOUNTER — Ambulatory Visit: Admitting: Cardiology

## 2024-04-13 ENCOUNTER — Ambulatory Visit: Admitting: Cardiology
